# Patient Record
Sex: Male | Born: 1935 | Race: White | Hispanic: No | Marital: Married | Smoking: Never smoker
Health system: Southern US, Community
[De-identification: ages and names within clinical notes are randomized; demographics above are authoritative.]

## PROBLEM LIST (undated history)

## (undated) DIAGNOSIS — C801 Malignant (primary) neoplasm, unspecified: Secondary | ICD-10-CM

## (undated) DIAGNOSIS — Z87891 Personal history of nicotine dependence: Secondary | ICD-10-CM

## (undated) DIAGNOSIS — E7800 Pure hypercholesterolemia, unspecified: Secondary | ICD-10-CM

## (undated) DIAGNOSIS — F419 Anxiety disorder, unspecified: Secondary | ICD-10-CM

## (undated) DIAGNOSIS — F32A Depression, unspecified: Secondary | ICD-10-CM

## (undated) DIAGNOSIS — I219 Acute myocardial infarction, unspecified: Secondary | ICD-10-CM

## (undated) DIAGNOSIS — I2510 Atherosclerotic heart disease of native coronary artery without angina pectoris: Secondary | ICD-10-CM

## (undated) DIAGNOSIS — K829 Disease of gallbladder, unspecified: Secondary | ICD-10-CM

## (undated) DIAGNOSIS — I252 Old myocardial infarction: Secondary | ICD-10-CM

## (undated) DIAGNOSIS — F329 Major depressive disorder, single episode, unspecified: Secondary | ICD-10-CM

## (undated) DIAGNOSIS — M629 Disorder of muscle, unspecified: Secondary | ICD-10-CM

## (undated) DIAGNOSIS — J439 Emphysema, unspecified: Secondary | ICD-10-CM

## (undated) DIAGNOSIS — K859 Acute pancreatitis without necrosis or infection, unspecified: Secondary | ICD-10-CM

## (undated) DIAGNOSIS — M539 Dorsopathy, unspecified: Secondary | ICD-10-CM

## (undated) DIAGNOSIS — J45909 Unspecified asthma, uncomplicated: Secondary | ICD-10-CM

## (undated) DIAGNOSIS — E119 Type 2 diabetes mellitus without complications: Secondary | ICD-10-CM

## (undated) DIAGNOSIS — Z86718 Personal history of other venous thrombosis and embolism: Secondary | ICD-10-CM

## (undated) DIAGNOSIS — I499 Cardiac arrhythmia, unspecified: Secondary | ICD-10-CM

## (undated) HISTORY — DX: Acute pancreatitis without necrosis or infection, unspecified: K85.90

## (undated) HISTORY — DX: Disorder of muscle, unspecified: M62.9

## (undated) HISTORY — DX: Dorsopathy, unspecified: M53.9

## (undated) HISTORY — DX: Old myocardial infarction: I25.2

## (undated) HISTORY — DX: Malignant (primary) neoplasm, unspecified: C80.1

## (undated) HISTORY — PX: CHOLECYSTECTOMY: SHX55

## (undated) HISTORY — DX: Disease of gallbladder, unspecified: K82.9

## (undated) HISTORY — DX: Personal history of nicotine dependence: Z87.891

## (undated) HISTORY — DX: Type 2 diabetes mellitus without complications: E11.9

## (undated) HISTORY — DX: Cardiac arrhythmia, unspecified: I49.9

## (undated) HISTORY — PX: BACK SURGERY: SHX140

## (undated) HISTORY — DX: Pure hypercholesterolemia, unspecified: E78.00

## (undated) HISTORY — DX: Atherosclerotic heart disease of native coronary artery without angina pectoris: I25.10

## (undated) HISTORY — DX: Emphysema, unspecified: J43.9

## (undated) HISTORY — DX: Unspecified asthma, uncomplicated: J45.909

## (undated) HISTORY — DX: Anxiety disorder, unspecified: F41.9

## (undated) HISTORY — DX: Acute myocardial infarction, unspecified: I21.9

## (undated) HISTORY — DX: Depression, unspecified: F32.A

## (undated) HISTORY — PX: OTHER SURGICAL HISTORY: SHX169

## (undated) HISTORY — DX: Major depressive disorder, single episode, unspecified: F32.9

## (undated) HISTORY — DX: Personal history of other venous thrombosis and embolism: Z86.718

## (undated) HISTORY — PX: APPENDECTOMY: SHX54

## (undated) HISTORY — PX: KNEE SURGERY: SHX244

---

## 2004-10-25 DIAGNOSIS — I10 Essential (primary) hypertension: Secondary | ICD-10-CM | POA: Insufficient documentation

## 2007-11-27 ENCOUNTER — Ambulatory Visit: Payer: Self-pay | Admitting: Family Medicine

## 2007-12-09 ENCOUNTER — Encounter: Payer: Self-pay | Admitting: Family Medicine

## 2008-01-03 ENCOUNTER — Encounter: Payer: Self-pay | Admitting: Family Medicine

## 2008-02-05 ENCOUNTER — Ambulatory Visit: Payer: Self-pay | Admitting: Family Medicine

## 2009-11-04 ENCOUNTER — Ambulatory Visit: Payer: Self-pay | Admitting: Family Medicine

## 2010-11-10 DIAGNOSIS — E113599 Type 2 diabetes mellitus with proliferative diabetic retinopathy without macular edema, unspecified eye: Secondary | ICD-10-CM | POA: Insufficient documentation

## 2011-01-10 DIAGNOSIS — F3340 Major depressive disorder, recurrent, in remission, unspecified: Secondary | ICD-10-CM | POA: Insufficient documentation

## 2011-01-16 DIAGNOSIS — L570 Actinic keratosis: Secondary | ICD-10-CM | POA: Insufficient documentation

## 2011-01-16 DIAGNOSIS — Z85828 Personal history of other malignant neoplasm of skin: Secondary | ICD-10-CM | POA: Insufficient documentation

## 2011-03-04 DIAGNOSIS — I4891 Unspecified atrial fibrillation: Secondary | ICD-10-CM | POA: Insufficient documentation

## 2011-03-07 DIAGNOSIS — E7800 Pure hypercholesterolemia, unspecified: Secondary | ICD-10-CM | POA: Insufficient documentation

## 2011-03-07 DIAGNOSIS — I5020 Unspecified systolic (congestive) heart failure: Secondary | ICD-10-CM | POA: Insufficient documentation

## 2011-10-06 DIAGNOSIS — E11311 Type 2 diabetes mellitus with unspecified diabetic retinopathy with macular edema: Secondary | ICD-10-CM | POA: Insufficient documentation

## 2011-10-06 DIAGNOSIS — E113399 Type 2 diabetes mellitus with moderate nonproliferative diabetic retinopathy without macular edema, unspecified eye: Secondary | ICD-10-CM | POA: Insufficient documentation

## 2011-10-06 DIAGNOSIS — H40009 Preglaucoma, unspecified, unspecified eye: Secondary | ICD-10-CM | POA: Insufficient documentation

## 2011-10-11 DIAGNOSIS — L280 Lichen simplex chronicus: Secondary | ICD-10-CM | POA: Insufficient documentation

## 2011-10-16 DIAGNOSIS — M7530 Calcific tendinitis of unspecified shoulder: Secondary | ICD-10-CM | POA: Insufficient documentation

## 2011-11-22 DIAGNOSIS — Z87898 Personal history of other specified conditions: Secondary | ICD-10-CM | POA: Insufficient documentation

## 2012-02-06 ENCOUNTER — Inpatient Hospital Stay: Payer: Self-pay | Admitting: Internal Medicine

## 2012-02-06 LAB — URINALYSIS, COMPLETE
Bilirubin,UR: NEGATIVE
Glucose,UR: NEGATIVE mg/dL (ref 0–75)
Hyaline Cast: 15
Leukocyte Esterase: NEGATIVE
Nitrite: NEGATIVE
Protein: 100
RBC,UR: 5 /HPF (ref 0–5)
Specific Gravity: 1.011 (ref 1.003–1.030)
Squamous Epithelial: NONE SEEN
WBC UR: <1 /HPF (ref 0–5)

## 2012-02-06 LAB — CK-MB: CK-MB: 2.4 ng/mL (ref 0.5–3.6)

## 2012-02-06 LAB — COMPREHENSIVE METABOLIC PANEL
Anion Gap: 7 (ref 7–16)
Bilirubin,Total: 1.2 mg/dL — ABNORMAL HIGH (ref 0.2–1.0)
EGFR (African American): >60
EGFR (Non-African Amer.): 54 — ABNORMAL LOW
Potassium: 4.2 mmol/L (ref 3.5–5.1)
SGOT(AST): 29 U/L (ref 15–37)
SGPT (ALT): 32 U/L
Sodium: 133 mmol/L — ABNORMAL LOW (ref 136–145)

## 2012-02-06 LAB — CBC
MCH: 30.7 pg (ref 26.0–34.0)
MCV: 89 fL (ref 80–100)

## 2012-02-06 LAB — PROTIME-INR
INR: 1.2
Prothrombin Time: 15.5 secs — ABNORMAL HIGH (ref 11.5–14.7)

## 2012-02-06 LAB — CK TOTAL AND CKMB (NOT AT ARMC): CK, Total: 165 U/L (ref 35–232)

## 2012-02-06 LAB — TROPONIN I: Troponin-I: 0.04 ng/mL

## 2012-02-07 LAB — CBC WITH DIFFERENTIAL/PLATELET
Basophil #: 0 10*3/uL (ref 0.0–0.1)
Basophil %: 0.1 %
Eosinophil #: 0 10*3/uL (ref 0.0–0.7)
Eosinophil %: 0 %
Lymphocyte #: 0.5 10*3/uL — ABNORMAL LOW (ref 1.0–3.6)
Lymphocyte %: 7.8 %
MCH: 31 pg (ref 26.0–34.0)
MCHC: 34.8 g/dL (ref 32.0–36.0)
MCV: 89 fL (ref 80–100)
Platelet: 194 10*3/uL (ref 150–440)
RDW: 16 % — ABNORMAL HIGH (ref 11.5–14.5)
WBC: 6.5 10*3/uL (ref 3.8–10.6)

## 2012-02-07 LAB — BASIC METABOLIC PANEL
Anion Gap: 9 (ref 7–16)
BUN: 32 mg/dL — ABNORMAL HIGH (ref 7–18)
Calcium, Total: 8.5 mg/dL (ref 8.5–10.1)
Chloride: 90 mmol/L — ABNORMAL LOW (ref 98–107)
Co2: 32 mmol/L (ref 21–32)
Creatinine: 1.25 mg/dL (ref 0.60–1.30)
EGFR (African American): >60
EGFR (Non-African Amer.): 57 — ABNORMAL LOW
Glucose: 426 mg/dL — ABNORMAL HIGH (ref 65–99)
Sodium: 131 mmol/L — ABNORMAL LOW (ref 136–145)

## 2012-02-07 LAB — CK-MB: CK-MB: 4.1 ng/mL — ABNORMAL HIGH (ref 0.5–3.6)

## 2012-02-08 LAB — URINE CULTURE

## 2012-02-09 LAB — EXPECTORATED SPUTUM ASSESSMENT W GRAM STAIN, RFLX TO RESP C

## 2012-02-12 LAB — CULTURE, BLOOD (SINGLE)

## 2012-04-05 DIAGNOSIS — E039 Hypothyroidism, unspecified: Secondary | ICD-10-CM | POA: Insufficient documentation

## 2012-07-31 ENCOUNTER — Ambulatory Visit: Payer: Self-pay | Admitting: Ophthalmology

## 2012-07-31 LAB — PROTIME-INR: INR: 1.7

## 2012-08-14 ENCOUNTER — Ambulatory Visit: Payer: Self-pay | Admitting: Ophthalmology

## 2013-01-17 DIAGNOSIS — E119 Type 2 diabetes mellitus without complications: Secondary | ICD-10-CM | POA: Insufficient documentation

## 2013-01-17 DIAGNOSIS — N400 Enlarged prostate without lower urinary tract symptoms: Secondary | ICD-10-CM | POA: Insufficient documentation

## 2013-01-17 DIAGNOSIS — G4733 Obstructive sleep apnea (adult) (pediatric): Secondary | ICD-10-CM | POA: Insufficient documentation

## 2013-01-17 DIAGNOSIS — J449 Chronic obstructive pulmonary disease, unspecified: Secondary | ICD-10-CM | POA: Insufficient documentation

## 2013-01-17 DIAGNOSIS — I2510 Atherosclerotic heart disease of native coronary artery without angina pectoris: Secondary | ICD-10-CM | POA: Insufficient documentation

## 2013-08-07 IMAGING — CR DG CHEST 1V PORT
1 series · 1 of 1 positions shown · non-contrast
Comparison: none

REASON FOR EXAM: short of breath
COMMENTS:

PROCEDURE:     DXR - DXR PORTABLE CHEST SINGLE VIEW  - February 06, 2012  [DATE]
RESULT:     Right lower lobe infiltrate is noted consistent with pneumonia.

[portable]
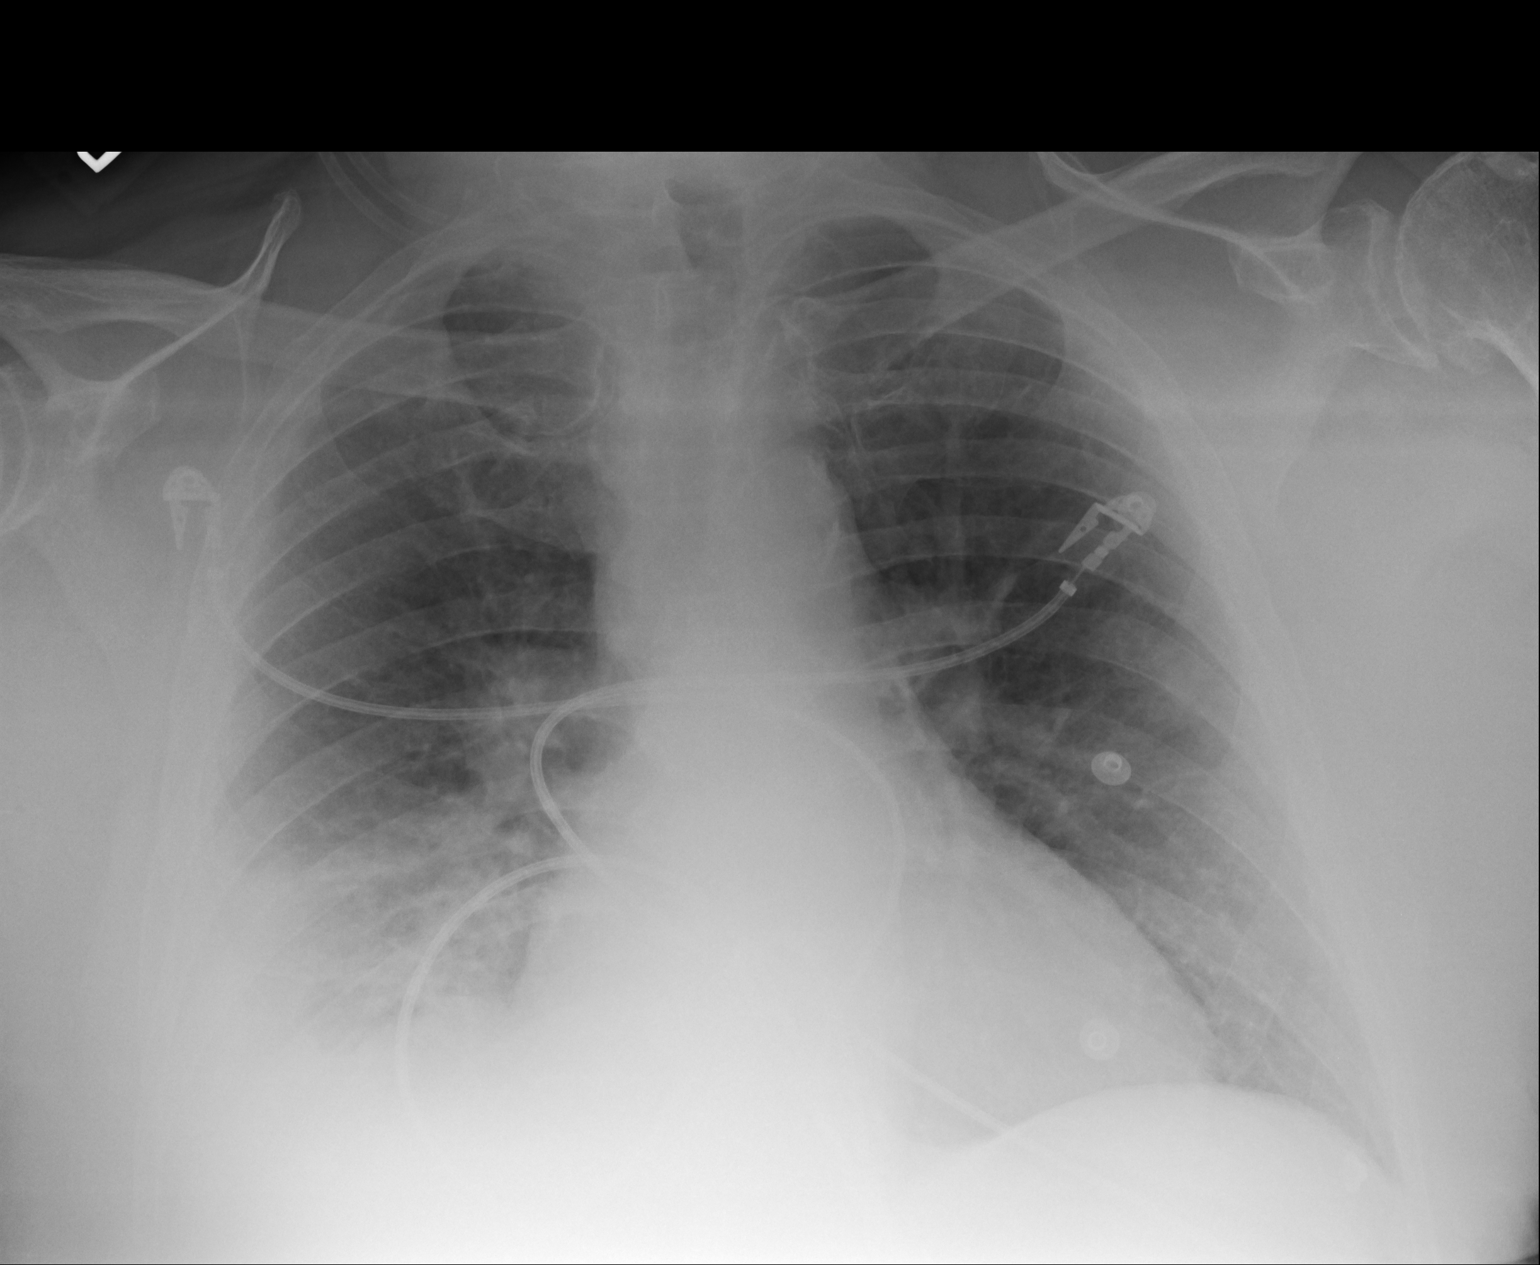

[1 of 1 positions shown; findings below may reference images not displayed]

IMPRESSION: Right lower lobe pneumonia is noted.

## 2014-09-04 DIAGNOSIS — C801 Malignant (primary) neoplasm, unspecified: Secondary | ICD-10-CM

## 2014-09-04 HISTORY — DX: Malignant (primary) neoplasm, unspecified: C80.1

## 2014-11-19 DIAGNOSIS — E1122 Type 2 diabetes mellitus with diabetic chronic kidney disease: Secondary | ICD-10-CM | POA: Insufficient documentation

## 2014-12-22 NOTE — Op Note (Signed)
PATIENT NAME:  Bryan Merritt, Bryan Merritt MR#:  [id number] DATE OF BIRTH:  September 05, 1935  DATE OF PROCEDURE:  08/14/2012  PROCEDURES PERFORMED:  1. Pars plana vitrectomy of the right eye.  2. Panretinal photocoagulation of the right eye.   PREOPERATIVE DIAGNOSES:  1. Nonclearing vitreous hemorrhage.  2. Proliferative retinopathy.   POSTOPERATIVE DIAGNOSES:  1. Nonclearing vitreous hemorrhage.  2. Proliferative retinopathy.   ESTIMATED BLOOD LOSS: Less than 1 mL.   PRIMARY SURGEON: Teresa Pelton. Arianah Torgeson, MD  ANESTHESIA: Retrobulbar block of the right eye with monitored anesthesia care.   COMPLICATIONS: None.   ESTIMATED BLOOD LOSS: Less than 1 mL.   INDICATION FOR PROCEDURE: This is a patient who presented to my office with decreased vision in the right eye. Examination revealed a vitreous hemorrhage centrally over the fovea. Several months were allowed to elapse and the patient bled multiple times. Risks, benefits, and alternatives of the above procedure were discussed and the patient wished to proceed.   DETAILS OF PROCEDURE: After informed consent was obtained, the patient was brought into the operative suite at [facility]. Patient was placed in supine position, was given a small dose of Alfenta and a retrobulbar block was performed on the right eye by the primary surgeon without any complications. Right eye was prepped and draped in sterile manner. After lid speculum was inserted, a 25-gauge trocar was placed inferotemporally through displaced conjunctiva in an oblique fashion. A 25-gauge trocar was placed inferotemporally through displaced conjunctiva in an oblique fashion 3 mm beyond the limbus. The infusion cannula was turned on and inserted through the trocar and secured in position with Steri-Strips. Two more trocars were placed in a similar fashion superotemporally and superonasally. Vitreous cutter and light pipe were introduced in the eye and a core vitrectomy was  performed. During the course of vitrectomy the patient was awake and began to move his head. Patient was reminded to remain still. The posterior vitreous face was confirmed to be elevated using suction. Peripheral vitreous was trimmed for 360 degrees without any complications. Endolaser was introduced into the eye and panretinal photocoagulation was performed. During the course of panretinal photocoagulation, approximately 90% complete, patient began to jerk his head and one of the trocars was removed. A scleral depressed exam was performed and no signs of any breaks or tears could be identified for 360 degrees and a partial air-fluid exchange was then performed and remaining trocars were removed. The posterior retina and peripheral retina, again, was noted to be undamaged due to movement. Panretinal photocoagulation was confirmed to be 90% complete. The wound sites were noted to be airtight and 5 mg of dexamethasone was given into the inferior fornix. The lid speculum was removed and the eye was cleaned. TobraDex was placed on the eye and a patch and shield were placed over the eye. Patient was taken to postanesthesia care with instructions to remain head up. We will do a staged completion of his panretinal photocoagulation in the office.   ____________________________ Teresa Pelton. Kaleel Schmieder, MD mfa:cms D: 08/14/2012 10:51:32 ET T: 08/14/2012 11:09:36 ET JOB#: [id number]  cc: Teresa Pelton. Starling Manns, MD, <Dictator>  Coralee Rud MD ELECTRONICALLY SIGNED 08/21/2012 9:36

## 2014-12-27 NOTE — Discharge Summary (Signed)
PATIENT NAME:  Bryan Merritt, Bryan Merritt MR#:  [id number] DATE OF BIRTH:  June 25, 1936  DATE OF ADMISSION:  02/06/2012 DATE OF DISCHARGE:  02/07/2012  The patient LEFT AGAINST MEDICAL ADVICE.  No Discharge Diagnosis or medication list will be dictated since patient left AGAINST MEDICAL ADVICE.   HISTORY OF PRESENT ILLNESS/HOSPITAL COURSE: Mr. Bryan Merritt is a 79 year old Caucasian gentleman with multiple medical problems who was admitted from the Emergency Room with fever, shortness of breath and SIRS due to right lower lobe pneumonia. He was started on IV fluids, continued on oxygen, IV Solu-Medrol and all his home medications. Patient and wife were upset since EMS brought patient to [facility] instead of [facility] per their request, however. They were requesting transfer to [facility] which I tried calling [facility] transfer center. There was a long wait and patient and wife were not agreeable to stay inhouse for further treatment and management. Patient relations was involved. Patient's wife discussed with patient relations. They decided to leave Bryan Merritt. Risks and complications were discussed and they seemed to voice understanding.   TIME SPENT: 40 minutes.  ____________________________ Hart Rochester Posey Pronto, MD sap:cms D: 02/07/2012 14:39:21 ET T: 02/08/2012 09:39:48 ET JOB#: [id number]  cc: Dreydon Cardenas A. Posey Pronto, MD, <Dictator>  Ilda Basset MD ELECTRONICALLY SIGNED 02/10/2012 14:13

## 2014-12-27 NOTE — H&P (Signed)
PATIENT NAME:  Bryan Merritt, Bryan Merritt MR#:  [id number] DATE OF BIRTH:  [birth year]  DATE OF ADMISSION:  02/06/2012  PRIMARY CARE PHYSICIAN: Dr. Antony Contras at [facility] in [place]: The patient is a 79 year old Caucasian male with past medical history significant for history of COPD, history of coronary artery disease status post MI in the past, status post four stents placed in his coronary arteries, history of hyperlipidemia, diabetes mellitus for the past 20 and more years who presented to the hospital with complaints of cough and not feeling well. According to the patient, he was doing well up until approximately 3 or 4 days ago when he started having cough which he actually caught from his family members. He has been coughing and producing some yellow phlegm. He also admits of having some fevers. On arrival to the Emergency Room, his fever was as high as 102.8. He feels very uncomfortable. He admits of some wheezing as well as increasing shortness of breath as well as sore throat. He states that he is still able to drink some and eat, however, today he was not able to eat or drink much because he just did not feel well. He uses oxygen at home at 4.5 liters of oxygen through nasal cannula 24/7, however, over the past few days he was using more because of this relentless cough. On arrival to the Emergency Room, he had chest x-ray done which showed right-sided pneumonia and hospitalist services were contacted for admission. The patient is also complaining of fatigue and weakness, also painful respirations especially in the right lung area. Because of this discomfort, hospitalist services were contacted for admission.   PAST MEDICAL HISTORY:  1. History of COPD, oxygen dependent at 4.5 liters of oxygen through nasal cannula 24/7.   2. History of coronary artery disease status post MI in 1997, status post four stent placement in his coronary arteries. 3. History of  hyperlipidemia. 4. Diabetes mellitus for the past 20 and more years, insulin-dependent.  5. History of atrial fibrillation. 6. Benign prostatic hypertrophy. 7. Lower extremity swelling.  8. Questionable congestive heart failure. 9. History of hypertension. 10. Lumbar disk disease, status post surgery. 11. History of bilateral shoulder problem surgery after traumatic injury.  MEDICATIONS: 1. MiraLAX 17 grams 3 times daily. 2. Metoprolol extended-release 125 mg p.o. daily.  3. Nitrostat 0.4 mg sublingually as needed.  4. NovoLog 20 units three times daily.  5. Oxycodone 15 mg every four hours. 6. Paxil 20 mg p.o. daily.  7. Xanax 0.25 mg 3 times daily.  8. DuoNebs nebulizers 4 times daily.  9. Aspirin 81 mg p.o. daily.  10. Lipitor 40 mg p.o. daily.  11. Saline nasal gel 0.65% nasal gel one spray 3 times daily.  12. Coumadin 3 mg p.o. daily.  13. Vasotec 5 mg p.o. twice daily.  14. Flomax 0.4 mg p.o. daily.  15. Lantus 40 units three times daily.   16. Lasix 120 mg p.o. daily.  17. Magnesium gluconate 250 mg p.o. twice daily.   The patient's wife denies that patient has congestive heart failure, however, he is still continued on diuretics.  PAST SURGICAL HISTORY:  1. Gallbladder surgery. 2. Skin surgery because of cancer.  3. History of right small finger amputation. 4. History of cataract surgery, two, one year ago. 5. Previously mentioned surgeries.   ALLERGIES: None.   FAMILY HISTORY: The patient's father was alcoholic. He died of coronary artery disease at the age of 10. The  patient's mother had diabetes as well as she was blind. At the age of 24 she died of unknown abdominal cancer.   SOCIAL HISTORY: The patient is married and lives with his wife. Used to work in home improvement.   REVIEW OF SYSTEMS: Positive for fatigue and weakness, weight gain of approximately 15 pounds in the past 30 days. Admits of some blurring of vision, also glaucoma and cataracts, sore throat,  cough, wheezing, dyspnea, painful respirations in the right side of the chest, also episodes of chest pain several weeks ago in the middle of his chest, intermittent arrhythmias, heart beating really fast, also intermittent constipation, intermittent incontinence, feeling that he's not emptying his bladder, also dripping and inability to start his urine stream. Also admits of some fevers and chills. Denies any significant pains or weight loss. In regards to eyes, denies any double vision. ENT: Denies any tinnitus, allergies, epistaxis, sinus pain, dentures, difficulty swallowing. RESPIRATORY: Denies any hemoptysis. CARDIOVASCULAR: Denies any orthopnea, edema, palpitations, or syncope. GASTROINTESTINAL: Denies any nausea, vomiting, diarrhea, rectal bleeding, change in bowel habits. GENITOURINARY: Denies any dysuria, hematuria, frequency. ENDOCRINOLOGY: Denies any polydipsia, nocturia, thyroid problems, heat or cold intolerance, or thirst. HEMATOLOGIC: Denies any anemia, easy bruising, bleeding, swollen glands. SKIN: Denies any acne, rashes, lesions, or change in moles. MUSCULOSKELETAL: Denies arthritis, cramps, swelling, gout. NEUROLOGIC: No numbness, epilepsy, or tremor. PSYCHIATRY: Denies anxiety, insomnia, or depression.   PHYSICAL EXAMINATION:   VITAL SIGNS: On arrival to the hospital, temperature 102.8, pulse 100, respiratory rate 24, blood pressure 96/63, saturation 91% on oxygen therapy.   GENERAL: This is a well developed, well nourished, obese Caucasian male in no significant distress laying on the stretcher. He is somewhat somnolent but intermittently is opening his eyes and converses.   HEENT: His pupils are equal and reactive to light. Extraocular movements intact. No icterus or conjunctivitis. Has difficulty hearing. No pharyngeal erythema. Mucosa is somewhat dry.   NECK: Neck did not reveal any masses, supple, nontender. Thyroid not enlarged. No adenopathy. No JVD or carotid bruits  bilaterally. Full range of motion.   LUNGS: Markedly diminished breath sounds bilaterally. Few rhonchi as well as rales were heard. Few wheezes were heard and intermittent labored inspirations especially whenever he tries to move around, especially whenever he tries to sit up. He has increased effort to breathe and he is in respiratory distress. He has mild dullness to percussion on the right side and right lower lobe but otherwise whenever he lays down he seemed to be comfortable.   CARDIOVASCULAR: S1, S2 appreciated. No murmurs, gallops, or rubs. Rhythm irregularly irregular. PMI not lateralized. Chest is nontender to palpation. Somewhat diminished pedal pulses. No lower extremity edema, calf tenderness, or cyanosis was noted.   ABDOMEN: Protuberant, soft, nontender. Bowel sounds are present. No hepatosplenomegaly or masses were noted.   RECTAL: Deferred.   MUSCULOSKELETAL: Muscle strength able to move all extremities. Muscle strength is 5 out of 5 in all extremities. No cyanosis, degenerative joint disease, or kyphosis. Gait is not tested.   EXTREMITIES/SKIN: The patient does have amputation of distal phalanx of his right small finger. He also has healed scars from his operations on both shoulders as well as numerous other scarring of his skin but no other rashes, lesions, erythema, nodularity, or induration. Skin was warm and dry to palpation.   LYMPH: No adenopathy in the cervical region.   NEUROLOGICAL: Cranial nerves grossly intact. Sensory is intact. No dysarthria or aphasia.   NEUROLOGIC: Cranial nerves grossly intact.  Sensory is intact. No dysarthria or aphasia.   PSYCHIATRIC: The patient is alert, oriented to time, person, and place, cooperative. Memory is somewhat impaired but no significant confusion, agitation, or depression noted.   LABORATORY, DIAGNOSTIC, AND RADIOLOGICAL DATA: BMP showed glucose of 301, BUN 29, sodium 133, chloride 93, CO2 33, estimated GFR for non-African  American would be 54, calcium was 8.3, total bilirubin was 1.2, otherwise liver enzymes were normal. White blood cell count was normal at 8.4, hemoglobin 11.9, platelet count 193. Urinalysis yellow clear urine, negative for glucose, bilirubin or ketones, specific gravity 1.011, pH 5.0, 2+ blood, 100 mg/dL protein, negative for nitrites or leukocyte esterase, 5 red blood cells, less than 1 white blood cell, trace bacteria, no epithelial cells, 15 hyaline casts.   Chest x-ray, portable, single view, 02/06/2012 showed right lower lobe pneumonia.   EKG showed atrial fibrillation at rate 103, rapid ventricular response with premature aberrantly conducted complexes, low voltage QRS, no acute ST-T changes noted.   ASSESSMENT AND PLAN:  1. Systemic inflammatory response reaction. Admit patient to medical floor. Get blood cultures. Sputum cultures also are going to be ordered. The patient will be started on Rocephin as well as Zithromax due to concerns of right lower lobe pneumonia cause of systemic inflammatory response reaction. Will continue oxygen as well as DuoNebs, steroids, and will add Advair.  2. COPD exacerbation. Will continue Solu-Medrol, Advair tiotropium. Will continue oxygen therapy keeping pulse oximetry at around 88 to 92%.  3. Right lower lobe pneumonia. Antibiotics as well as cultures. Adjust antibiotics according to culture results.  4. Hyponatremia. Will be holding Lasix. Will continue the patient on low rate IV fluids and follow the patient's sodium level in the morning. 5. Hypotension. As above, will continue IV fluids. Will be holding Lasix for now.  6. Atrial fibrillation, RVR. Will continue Toprol as well as Coumadin therapy. Check pro-time stat.  7. Benign prostatic hypertrophy. Will continue Flomax. Will add finasteride. The patient does have significant symptomatology such as retention and also incomplete emptying.  8. Diabetes mellitus. Will continue home medications at lower doses  and will continue sliding scale insulin.  9. Hyperlipidemia. Continue Lipitor. 10. History of constipation. Continue MiraLAX.  11. History of anxiety and depression. Continue Paxil as well as Xanax.  12. History of degenerative disk disease versus degenerative joint disease. Continue oxycodone as well as Tylenol.   TIME SPENT: 50 minutes.    ____________________________ Theodoro Grist, MD rv:drc D: 02/06/2012 21:31:03 ET T: 02/07/2012 07:10:21 ET JOB#: [id number]  cc: Theodoro Grist, MD, <Dictator> Dr. Antony Contras at [facility] in [place] SIGNED 03/03/2012 11:08

## 2015-02-04 DIAGNOSIS — K219 Gastro-esophageal reflux disease without esophagitis: Secondary | ICD-10-CM | POA: Insufficient documentation

## 2015-06-15 DIAGNOSIS — Z7409 Other reduced mobility: Secondary | ICD-10-CM | POA: Insufficient documentation

## 2015-06-21 ENCOUNTER — Ambulatory Visit: Payer: Medicare Other | Attending: Pain Medicine | Admitting: Pain Medicine

## 2015-06-21 ENCOUNTER — Encounter: Payer: Self-pay | Admitting: Pain Medicine

## 2015-06-21 VITALS — BP 124/52 | HR 59 | Temp 98.5°F | Resp 18 | Ht 68.0 in | Wt 255.0 lb

## 2015-06-21 DIAGNOSIS — M25519 Pain in unspecified shoulder: Secondary | ICD-10-CM | POA: Diagnosis present

## 2015-06-21 DIAGNOSIS — J45909 Unspecified asthma, uncomplicated: Secondary | ICD-10-CM | POA: Insufficient documentation

## 2015-06-21 DIAGNOSIS — I2510 Atherosclerotic heart disease of native coronary artery without angina pectoris: Secondary | ICD-10-CM | POA: Diagnosis not present

## 2015-06-21 DIAGNOSIS — Z9889 Other specified postprocedural states: Secondary | ICD-10-CM | POA: Insufficient documentation

## 2015-06-21 DIAGNOSIS — I252 Old myocardial infarction: Secondary | ICD-10-CM | POA: Insufficient documentation

## 2015-06-21 DIAGNOSIS — E7800 Pure hypercholesterolemia, unspecified: Secondary | ICD-10-CM | POA: Diagnosis not present

## 2015-06-21 DIAGNOSIS — F411 Generalized anxiety disorder: Secondary | ICD-10-CM | POA: Insufficient documentation

## 2015-06-21 DIAGNOSIS — G894 Chronic pain syndrome: Secondary | ICD-10-CM | POA: Insufficient documentation

## 2015-06-21 DIAGNOSIS — M25512 Pain in left shoulder: Secondary | ICD-10-CM

## 2015-06-21 DIAGNOSIS — M549 Dorsalgia, unspecified: Secondary | ICD-10-CM

## 2015-06-21 DIAGNOSIS — M25511 Pain in right shoulder: Secondary | ICD-10-CM

## 2015-06-21 DIAGNOSIS — F1120 Opioid dependence, uncomplicated: Secondary | ICD-10-CM | POA: Diagnosis not present

## 2015-06-21 DIAGNOSIS — Z9229 Personal history of other drug therapy: Secondary | ICD-10-CM | POA: Insufficient documentation

## 2015-06-21 DIAGNOSIS — E669 Obesity, unspecified: Secondary | ICD-10-CM | POA: Insufficient documentation

## 2015-06-21 DIAGNOSIS — M25561 Pain in right knee: Secondary | ICD-10-CM

## 2015-06-21 DIAGNOSIS — E1142 Type 2 diabetes mellitus with diabetic polyneuropathy: Secondary | ICD-10-CM | POA: Insufficient documentation

## 2015-06-21 DIAGNOSIS — F32A Depression, unspecified: Secondary | ICD-10-CM | POA: Insufficient documentation

## 2015-06-21 DIAGNOSIS — F418 Other specified anxiety disorders: Secondary | ICD-10-CM | POA: Diagnosis not present

## 2015-06-21 DIAGNOSIS — M545 Low back pain, unspecified: Secondary | ICD-10-CM | POA: Insufficient documentation

## 2015-06-21 DIAGNOSIS — G8929 Other chronic pain: Secondary | ICD-10-CM

## 2015-06-21 DIAGNOSIS — Z5181 Encounter for therapeutic drug level monitoring: Secondary | ICD-10-CM | POA: Insufficient documentation

## 2015-06-21 DIAGNOSIS — J439 Emphysema, unspecified: Secondary | ICD-10-CM

## 2015-06-21 DIAGNOSIS — E119 Type 2 diabetes mellitus without complications: Secondary | ICD-10-CM | POA: Diagnosis not present

## 2015-06-21 DIAGNOSIS — Z86718 Personal history of other venous thrombosis and embolism: Secondary | ICD-10-CM

## 2015-06-21 DIAGNOSIS — M25562 Pain in left knee: Secondary | ICD-10-CM

## 2015-06-21 DIAGNOSIS — F329 Major depressive disorder, single episode, unspecified: Secondary | ICD-10-CM | POA: Insufficient documentation

## 2015-06-21 DIAGNOSIS — F1190 Opioid use, unspecified, uncomplicated: Secondary | ICD-10-CM | POA: Diagnosis not present

## 2015-06-21 DIAGNOSIS — Z87891 Personal history of nicotine dependence: Secondary | ICD-10-CM

## 2015-06-21 DIAGNOSIS — Z79891 Long term (current) use of opiate analgesic: Secondary | ICD-10-CM | POA: Insufficient documentation

## 2015-06-21 DIAGNOSIS — H548 Legal blindness, as defined in USA: Secondary | ICD-10-CM | POA: Insufficient documentation

## 2015-06-21 HISTORY — DX: Personal history of nicotine dependence: Z87.891

## 2015-06-21 HISTORY — DX: Old myocardial infarction: I25.2

## 2015-06-21 HISTORY — DX: Emphysema, unspecified: J43.9

## 2015-06-21 HISTORY — DX: Personal history of other venous thrombosis and embolism: Z86.718

## 2015-06-21 MED ORDER — OXYCODONE HCL 5 MG PO TABS
5.0000 mg | ORAL_TABLET | Freq: Three times a day (TID) | ORAL | Status: DC | PRN
Start: 2015-06-21 — End: 2015-09-20

## 2015-06-21 MED ORDER — OXYCODONE HCL 5 MG PO TABS: 5.0000 mg | ORAL_TABLET | Freq: Three times a day (TID) | ORAL | Status: DC | PRN

## 2015-06-21 MED ORDER — OXYCODONE HCL 15 MG PO TABS: 15.0000 mg | ORAL_TABLET | Freq: Three times a day (TID) | ORAL | Status: DC | PRN

## 2015-06-21 NOTE — Progress Notes (Signed)
Patient's Name: Bryan Merritt MRN: [id number] DOB: 1936-07-06 DOS: 06/21/2015  Primary Reason(s) for Visit: Encounter for Medication Management. CC: Shoulder Pain   HPI:   Mr. Lienhard is a 79 y.o. year old, male patient, who returns today as an established patient. He has Encounter for therapeutic drug level monitoring; Long term current use of opiate analgesic; Uncomplicated opioid dependence ([place]); Opiate use; Bilateral shoulder pain; Chronic pain syndrome; Actinic keratoses; Atrial fibrillation ([place]); Borderline glaucoma; Benign fibroma of prostate; Arteriosclerosis of coronary artery; Calcific shoulder tendinitis; Chronic pain associated with significant psychosocial dysfunction; Type 2 diabetes mellitus with diabetic chronic kidney disease ([place]); Chronic obstructive pulmonary disease ([place]); Diabetic macular edema ([place]); Gastro-esophageal reflux disease without esophagitis; Type 2 diabetes mellitus ([place]); H/O neoplasm; Hypercholesterolemia; Benign hypertension; Adult hypothyroidism; Other reduced mobility; Lichenification and lichen simplex chronicus; LBP (low back pain); Recurrent major depression in remission ([place]); Moderate nonproliferative diabetic retinopathy ([place]); Obstructive apnea; H/O malignant neoplasm of skin; Other sexual dysfunction not due to a substance or known physiological condition; Systolic heart failure ([place]); Proliferative diabetic retinopathy ([place]); Chronic pain of both shoulders; Chronic low back pain; Chronic upper back pain; Bilateral chronic knee pain; Legally blind; History of knee surgery; Pain in joint, upper arm; Obesity, Class II, BMI 35-39.9; Diabetic peripheral neuropathy ([place]); Hx of long term use of blood thinners; Generalized anxiety disorder; Depression; History of MI (myocardial infarction); History of DVT (deep vein thrombosis); Ex-smoker; and Emphysema lung ([place]) on his problem list.. His primarily concern today is the Shoulder Pain   The patient is having some  significant bilateral shoulder pain and would like to have both shoulders injected. He refers having had the shoulders injected manual at times in the past, but none in the past year. Unfortunately, since his last injection he has been placed on Coumadin. He has a history of multiple coronary artery bypass surgeries as well as atrial fibrillation. We will need to contact his cardiologist to get clearance before he can stop them blood thinners in order to do the shoulder injections. I have had the opportunity to talk to the patient and his family about the risk of infection and hemarthrosis. They understood and accepted.  Pharmacotherapy Review: Side-effects or Adverse reactions: None reported. Effectiveness: Described as relatively effective, allowing for increase in activities of daily living (ADL). Onset of action: Within expected pharmacological parameters. Duration of action: Within normal limits for medication. Peak effect: Timing and results are as within normal expected parameters. [place] PMP: Compliant with practice rules and regulations. DST: Compliant with practice rules and regulations. Lab work: No new labs ordered by our practice. Treatment compliance: Compliant. Substance Use Disorder (SUD) Risk Level: Low Planned course of action: Continue therapy as is.  Allergies: Mr. Maret is allergic to lisinopril.  Meds: The patient has a current medication list which includes the following prescription(s): alprazolam, aspirin, atorvastatin, furosemide, insulin glargine, insulin regular, ipratropium, magnesium oxide, metoprolol succinate, nitroglycerin, omeprazole, polyethylene glycol, tamsulosin, terconazole, warfarin, warfarin, oxycodone, oxycodone, oxycodone, oxycodone, oxycodone, and oxycodone. Requested Prescriptions   Signed Prescriptions Disp Refills  . oxyCODONE (ROXICODONE) 15 MG immediate release tablet 90 tablet 0    Sig: Take 1 tablet (15 mg total) by mouth every 8 (eight) hours as  needed for pain.  Marland Kitchen oxyCODONE (OXY IR/ROXICODONE) 5 MG immediate release tablet 90 tablet 0    Sig: Take 1 tablet (5 mg total) by mouth every 8 (eight) hours as needed for severe pain.  Marland Kitchen oxyCODONE (ROXICODONE) 15 MG immediate release tablet 90 tablet  0    Sig: Take 1 tablet (15 mg total) by mouth every 8 (eight) hours as needed for pain.  Marland Kitchen oxyCODONE (OXY IR/ROXICODONE) 5 MG immediate release tablet 90 tablet 0    Sig: Take 1 tablet (5 mg total) by mouth every 8 (eight) hours as needed for severe pain.  Marland Kitchen oxyCODONE (ROXICODONE) 15 MG immediate release tablet 90 tablet 0    Sig: Take 1 tablet (15 mg total) by mouth every 8 (eight) hours as needed for pain.  Marland Kitchen oxyCODONE (OXY IR/ROXICODONE) 5 MG immediate release tablet 90 tablet 0    Sig: Take 1 tablet (5 mg total) by mouth every 8 (eight) hours as needed for severe pain.    ROS: Constitutional: Afebrile, no chills, well hydrated and well nourished Gastrointestinal: negative Musculoskeletal:negative Neurological: negative Behavioral/Psych: negative  PFSH: Medical:  Mr. Kretschmer  has a past medical history of Hypercholesteremia; CAD (coronary artery disease); MI (myocardial infarction) ([place]); Cardiac arrhythmia; Emphysema of lung ([place]); Asthma; Pancreatitis; Gallbladder disease; Diabetes mellitus without complication ([place]); Spine disorder; Muscle disorder; Depression; Anxiety; Cancer ([place]) (2016); History of DVT (deep vein thrombosis) (06/21/2015); History of MI (myocardial infarction) (06/21/2015); Ex-smoker (06/21/2015); and Emphysema lung ([place]) (06/21/2015). Family: family history includes Alcohol abuse in his father; Cancer in his mother; Diabetes in his mother; Heart disease in his father. Surgical:  has past surgical history that includes cardiac stents; Cholecystectomy; Appendectomy; Back surgery; and Knee surgery (Bilateral). Tobacco:  has no tobacco history on file. Alcohol:  has no alcohol history on file. Drug:  has no drug history on  file.  Physical Exam: Vitals:  Today's Vitals   06/21/15 1410 06/21/15 1413  BP: 124/52   Pulse: 59   Temp: 98.5 F (36.9 C)   Resp: 18   Height: 5\' 8"  (1.727 m)   Weight: 255 lb (115.667 kg)   SpO2:  99%  PainSc: 8  8   PainLoc: Shoulder   Calculated BMI: Body mass index is 38.78 kg/(m^2). General appearance: alert, cooperative, appears stated age, mild distress and morbidly obese Eyes: conjunctivae/corneas clear. PERRL, EOM's intact. Fundi benign. Lungs: No evidence respiratory distress, no audible rales or ronchi and no use of accessory muscles of respiration Neck: no adenopathy, no carotid bruit, no JVD, supple, symmetrical, trachea midline and thyroid not enlarged, symmetric, no tenderness/mass/nodules Back: symmetric, no curvature. ROM normal. No CVA tenderness. Extremities: extremities normal, atraumatic, no cyanosis or edema Pulses: 2+ and symmetric Skin: Skin color, texture, turgor normal. No rashes or lesions Neurologic: Gait: The patient ambulates in a wheelchair.    Assessment: Encounter Diagnosis:  Primary Diagnosis: Chronic pain syndrome [G89.4]  Plan: [place] was seen today for shoulder pain.  Diagnoses and all orders for this visit:  Chronic pain syndrome  Bilateral shoulder pain Comments: The family the patient presents to the pain clinic on 06/21/2015 with a flareup of his bilateral shoulder pain. (Acute on chronic pain) Orders: -     SHOULDER INJECTION; Future  Opiate use  Uncomplicated opioid dependence ([place])  Long term current use of opiate analgesic -     Drugs of abuse screen w/o alc, rtn urine-sln; Future  Encounter for therapeutic drug level monitoring  Chronic pain of both shoulders  Other orders -     oxyCODONE (ROXICODONE) 15 MG immediate release tablet; Take 1 tablet (15 mg total) by mouth every 8 (eight) hours as needed for pain. -     oxyCODONE (OXY IR/ROXICODONE) 5 MG immediate release tablet; Take 1 tablet (5 mg  total) by mouth  every 8 (eight) hours as needed for severe pain. -     oxyCODONE (ROXICODONE) 15 MG immediate release tablet; Take 1 tablet (15 mg total) by mouth every 8 (eight) hours as needed for pain. -     oxyCODONE (OXY IR/ROXICODONE) 5 MG immediate release tablet; Take 1 tablet (5 mg total) by mouth every 8 (eight) hours as needed for severe pain. -     oxyCODONE (ROXICODONE) 15 MG immediate release tablet; Take 1 tablet (15 mg total) by mouth every 8 (eight) hours as needed for pain. -     oxyCODONE (OXY IR/ROXICODONE) 5 MG immediate release tablet; Take 1 tablet (5 mg total) by mouth every 8 (eight) hours as needed for severe pain.     There are no Patient Instructions on file for this visit. Medications discontinued today:  Medications Discontinued During This Encounter  Medication Reason  . oxycodone (OXY-IR) 5 MG capsule Duplicate  . clopidogrel (PLAVIX) 75 MG tablet Error   Medications administered today:  Mr. Walgren had no medications administered during this visit.  Primary Care Physician: Shella Spearing, MD Location: [facility] Note by: Kathlen Brunswick. Dossie Arbour, M.D, DABA, DABAPM, DABPM, DABIPP, FIPP

## 2015-06-21 NOTE — Progress Notes (Signed)
Oxycodone 15 mg Pill count #16/90 Oxycodone 5 mg pill count # 16

## 2015-06-21 NOTE — Progress Notes (Signed)
Safety precautions to be maintained throughout the outpatient stay will include: orient to surroundings, keep bed in low position, maintain call bell within reach at all times, provide assistance with transfer out of bed and ambulation.  

## 2015-06-21 NOTE — Progress Notes (Signed)
Dr Dossie Arbour sent communication to  Bryan Mann, NP for permission to stop Coumadin.

## 2015-07-13 ENCOUNTER — Telehealth: Payer: Self-pay | Admitting: Pain Medicine

## 2015-07-13 NOTE — Telephone Encounter (Signed)
Has Bryan Merritt been cleared by his heart phys to have a procedure

## 2015-07-19 NOTE — Telephone Encounter (Signed)
Go ahead and have the patient is scheduled for a bilateral intra-articular shoulder injection under fluoroscopic guidance. Asked the patient if he would like to have it done with or without sedation. Make sure that the patient stops today's Coumadin for 5 days prior to the procedure. If you have any questions with regards to the paralysis on how to do this, please ask Kory.

## 2015-08-02 ENCOUNTER — Telehealth: Payer: Self-pay | Admitting: Pain Medicine

## 2015-08-02 NOTE — Telephone Encounter (Signed)
Kori have you sent note to Dr. Priscella Mann, cardiologist to see if mr Gladwin can stop warfarin so he can have procedure

## 2015-08-04 NOTE — Telephone Encounter (Signed)
Patient's wife notified that a note was sent to Dr. Bobby Rumpf, but we have not heard anything from them.

## 2015-09-01 DIAGNOSIS — R0681 Apnea, not elsewhere classified: Secondary | ICD-10-CM | POA: Insufficient documentation

## 2015-09-20 ENCOUNTER — Other Ambulatory Visit
Admission: RE | Admit: 2015-09-20 | Discharge: 2015-09-20 | Disposition: A | Discharge status: Home / Self Care | Payer: Medicare Other | Source: Ambulatory Visit | Attending: Pain Medicine | Admitting: Pain Medicine

## 2015-09-20 ENCOUNTER — Ambulatory Visit: Payer: Medicare Other | Attending: Pain Medicine | Admitting: Pain Medicine

## 2015-09-20 ENCOUNTER — Encounter: Payer: Self-pay | Admitting: Pain Medicine

## 2015-09-20 VITALS — BP 115/58 | HR 100 | Temp 97.9°F | Resp 16 | Ht 68.5 in | Wt 252.0 lb

## 2015-09-20 DIAGNOSIS — E1142 Type 2 diabetes mellitus with diabetic polyneuropathy: Secondary | ICD-10-CM | POA: Insufficient documentation

## 2015-09-20 DIAGNOSIS — I4891 Unspecified atrial fibrillation: Secondary | ICD-10-CM | POA: Diagnosis not present

## 2015-09-20 DIAGNOSIS — Z5181 Encounter for therapeutic drug level monitoring: Secondary | ICD-10-CM | POA: Diagnosis not present

## 2015-09-20 DIAGNOSIS — J449 Chronic obstructive pulmonary disease, unspecified: Secondary | ICD-10-CM | POA: Diagnosis not present

## 2015-09-20 DIAGNOSIS — Z86718 Personal history of other venous thrombosis and embolism: Secondary | ICD-10-CM | POA: Insufficient documentation

## 2015-09-20 DIAGNOSIS — N400 Enlarged prostate without lower urinary tract symptoms: Secondary | ICD-10-CM | POA: Diagnosis not present

## 2015-09-20 DIAGNOSIS — M25512 Pain in left shoulder: Secondary | ICD-10-CM | POA: Insufficient documentation

## 2015-09-20 DIAGNOSIS — I252 Old myocardial infarction: Secondary | ICD-10-CM | POA: Insufficient documentation

## 2015-09-20 DIAGNOSIS — H409 Unspecified glaucoma: Secondary | ICD-10-CM | POA: Insufficient documentation

## 2015-09-20 DIAGNOSIS — M810 Age-related osteoporosis without current pathological fracture: Secondary | ICD-10-CM | POA: Diagnosis not present

## 2015-09-20 DIAGNOSIS — M25511 Pain in right shoulder: Secondary | ICD-10-CM | POA: Insufficient documentation

## 2015-09-20 DIAGNOSIS — E669 Obesity, unspecified: Secondary | ICD-10-CM | POA: Diagnosis not present

## 2015-09-20 DIAGNOSIS — K219 Gastro-esophageal reflux disease without esophagitis: Secondary | ICD-10-CM | POA: Diagnosis not present

## 2015-09-20 DIAGNOSIS — Z9049 Acquired absence of other specified parts of digestive tract: Secondary | ICD-10-CM | POA: Insufficient documentation

## 2015-09-20 DIAGNOSIS — M792 Neuralgia and neuritis, unspecified: Secondary | ICD-10-CM | POA: Insufficient documentation

## 2015-09-20 DIAGNOSIS — I2510 Atherosclerotic heart disease of native coronary artery without angina pectoris: Secondary | ICD-10-CM | POA: Diagnosis not present

## 2015-09-20 DIAGNOSIS — F1190 Opioid use, unspecified, uncomplicated: Secondary | ICD-10-CM | POA: Insufficient documentation

## 2015-09-20 DIAGNOSIS — Z9181 History of falling: Secondary | ICD-10-CM | POA: Insufficient documentation

## 2015-09-20 DIAGNOSIS — Z79899 Other long term (current) drug therapy: Secondary | ICD-10-CM

## 2015-09-20 DIAGNOSIS — Z79891 Long term (current) use of opiate analgesic: Secondary | ICD-10-CM | POA: Diagnosis not present

## 2015-09-20 DIAGNOSIS — E1122 Type 2 diabetes mellitus with diabetic chronic kidney disease: Secondary | ICD-10-CM | POA: Insufficient documentation

## 2015-09-20 DIAGNOSIS — E7800 Pure hypercholesterolemia, unspecified: Secondary | ICD-10-CM | POA: Diagnosis not present

## 2015-09-20 DIAGNOSIS — G8929 Other chronic pain: Secondary | ICD-10-CM | POA: Insufficient documentation

## 2015-09-20 DIAGNOSIS — Z7189 Other specified counseling: Secondary | ICD-10-CM

## 2015-09-20 DIAGNOSIS — M545 Low back pain, unspecified: Secondary | ICD-10-CM

## 2015-09-20 DIAGNOSIS — R296 Repeated falls: Secondary | ICD-10-CM

## 2015-09-20 DIAGNOSIS — Q649 Congenital malformation of urinary system, unspecified: Secondary | ICD-10-CM

## 2015-09-20 LAB — COMPREHENSIVE METABOLIC PANEL
ALBUMIN: 3.9 g/dL (ref 3.5–5.0)
ALK PHOS: 52 U/L (ref 38–126)
ALT: 17 U/L (ref 17–63)
AST: 16 U/L (ref 15–41)
Anion gap: 8 (ref 5–15)
BILIRUBIN TOTAL: 1 mg/dL (ref 0.3–1.2)
BUN: 32 mg/dL — AB (ref 6–20)
CALCIUM: 8.6 mg/dL — AB (ref 8.9–10.3)
CO2: 33 mmol/L — ABNORMAL HIGH (ref 22–32)
CREATININE: 1.48 mg/dL — AB (ref 0.61–1.24)
Chloride: 95 mmol/L — ABNORMAL LOW (ref 101–111)
GFR calc Af Amer: 51 mL/min — ABNORMAL LOW (ref >60)
GFR calc non Af Amer: 44 mL/min — ABNORMAL LOW (ref >60)
GLUCOSE: 203 mg/dL — AB (ref 65–99)
Potassium: 3.8 mmol/L (ref 3.5–5.1)
Sodium: 136 mmol/L (ref 135–145)
TOTAL PROTEIN: 7.1 g/dL (ref 6.5–8.1)

## 2015-09-20 LAB — MAGNESIUM: Magnesium: 2.1 mg/dL (ref 1.7–2.4)

## 2015-09-20 LAB — SEDIMENTATION RATE: Sed Rate: 37 mm/hr — ABNORMAL HIGH (ref 0–20)

## 2015-09-20 MED ORDER — OXYCODONE HCL 5 MG PO TABS: 5.0000 mg | ORAL_TABLET | Freq: Three times a day (TID) | ORAL | Status: DC | PRN

## 2015-09-20 MED ORDER — OXYCODONE HCL 15 MG PO TABS: 15.0000 mg | ORAL_TABLET | Freq: Three times a day (TID) | ORAL | Status: DC | PRN

## 2015-09-20 NOTE — Assessment & Plan Note (Signed)
His feet are numb to the point were he is having problems with proprioception and balance. He continues to fall and he requires a walker at home or a wheelchair.

## 2015-09-20 NOTE — Progress Notes (Signed)
Patient's Name: Bryan Merritt MRN: [id number] DOB: Dec 03, 1935 DOS: 09/20/2015  Primary Reason(s) for Visit: Encounter for Medication Management CC: Other   HPI:  Bryan Merritt is a 80 y.o. year old, male patient, who returns today as an established patient. He has Encounter for therapeutic drug level monitoring; Long term current use of opiate analgesic; Uncomplicated opioid dependence ([place]); Opiate use; Bilateral shoulder pain (Location of Primary Source of Pain) (Bilateral) (R>L); Chronic pain syndrome; Actinic keratoses; Atrial fibrillation ([place]); Borderline glaucoma; Benign fibroma of prostate; Arteriosclerosis of coronary artery; Calcific shoulder tendinitis; Chronic pain associated with significant psychosocial dysfunction; Type 2 diabetes mellitus with diabetic chronic kidney disease ([place]); Chronic obstructive pulmonary disease ([place]); Diabetic macular edema ([place]); Gastro-esophageal reflux disease without esophagitis; Type 2 diabetes mellitus ([place]); H/O neoplasm; Hypercholesterolemia; Benign hypertension; Adult hypothyroidism; Other reduced mobility; Lichenification and lichen simplex chronicus; LBP (low back pain); Recurrent major depression in remission ([place]); Moderate nonproliferative diabetic retinopathy ([place]); Obstructive apnea; H/O malignant neoplasm of skin; Other sexual dysfunction not due to a substance or known physiological condition; Systolic heart failure ([place]); Proliferative diabetic retinopathy ([place]); Chronic pain of both shoulders; Chronic low back pain; Chronic upper back pain; Bilateral chronic knee pain; Legally blind; History of knee surgery; Pain in joint, upper arm; Obesity, Class II, BMI 35-39.9; Diabetic peripheral neuropathy ([place]); Hx of long term use of blood thinners; Generalized anxiety disorder; Depression; History of MI (myocardial infarction); History of DVT (deep vein thrombosis); Ex-smoker; Emphysema lung ([place]); Chronic pain; Long term prescription opiate use; Encounter for  chronic pain management; Breathlessness on exertion; Neurogenic pain; Neuropathic pain; Senile osteoporosis; Prostatic hypertrophy; Urinary anomaly; and At high risk for falls on his problem list.. His primarily concern today is the Other   The patient comes in today clinics today for pharmacological management of his chronic pain. Unfortunately, this is a second time that he says that he cannot provide [place] with a urine sample. The patient indicates that he is having a lot of problems voiding. In the middle of the night he wakes up with terrible pain and when he tries to avoid almost nothing comes out. He indicates that his stream is very weak and it feels as if there was an occlusion. He is also having problems with his kidneys. Therefore, today we will go ahead and get a blood sample since he indicates that he took his pain medicine before coming in. According to the pill Patient still has 20 of the 90 oxycodone 15 filled on 08/21/2015. In addition he also has 22 of the night 80 oxycodone 5 mg pills filled on 07/21/2016. When the patient was confronted about this, they indicated that they took the pills from bed 08/21/2015 refill and they put it on day 07/21/2016 bottle. They were informed that they should never do this and that they should always keep then was bottle.   Reported Pain Score: 2  (last pain medicine @ 1400) Reported level is inconsistent with clinical obrservations. Pain Type: Chronic pain Pain Location: Shoulder Pain Orientation: Left (bilateral, left is worse) Pain Descriptors / Indicators: Constant, Sharp (hurts like hell) Pain Frequency: Constant  Date of Last Visit: 06/20/16 Service Provided on Last Visit: Med Refill  Pharmacotherapy  Review:   Onset of action: Within expected pharmacological parameters Time to Peak effect: Timing and results are as within normal expected parameters Effectiveness: Described as relatively effective, allowing for increase in activities of  daily living (ADL) % Relief: More than 50% Side-effects or Adverse reactions: None reported Duration  of action: Within normal limits for medication [place] PMP: Compliant with practice rules and regulations UDS Results: No UDS available, at this time UDS Interpretation: No UDS available, at this time Medication Assessment Form: Reviewed. Patient indicates being compliant with therapy Treatment compliance: Compliant Substance Use Disorder (SUD) Risk Level: Low Pharmacologic Plan: Continue therapy as is  Lab Work: Illicit Drugs No results found for: THCU, COCAINSCRNUR, PCPSCRNUR, MDMA, AMPHETMU, METHADONE, ETOH  Inflammation Markers No results found for: ESRSEDRATE, CRP  Renal Function Lab Results  Component Value Date   BUN 32* 02/07/2012   CREATININE 1.25 02/07/2012   GFRAA >60 02/07/2012   GFRNONAA 57* 02/07/2012    Hepatic Function Lab Results  Component Value Date   AST 29 02/06/2012   ALT 32 02/06/2012   ALBUMIN 3.4 02/06/2012    Electrolytes Lab Results  Component Value Date   NA 131* 02/07/2012   K 4.2 07/31/2012   CL 90* 02/07/2012   CALCIUM 8.5 02/07/2012    Allergies:  Bryan Merritt is allergic to lisinopril.  Meds:  The patient has a current medication list which includes the following prescription(s): albuterol, alprazolam, aspirin, atorvastatin, furosemide, insulin glargine, insulin regular, ipratropium, magnesium oxide, metoprolol succinate, nitroglycerin, omeprazole, oxycodone, oxycodone, polyethylene glycol, tamsulosin, terconazole, warfarin, warfarin, oxycodone, oxycodone, oxycodone, and oxycodone.  ROS:  Constitutional: Afebrile, no chills, well hydrated and well nourished Gastrointestinal: negative Musculoskeletal:negative Neurological: negative Behavioral/Psych: negative  PFSH:  Medical:  Bryan Merritt  has a past medical history of Hypercholesteremia; CAD (coronary artery disease); MI (myocardial infarction) ([place]); Cardiac arrhythmia; Emphysema of lung  ([place]); Asthma; Pancreatitis; Gallbladder disease; Diabetes mellitus without complication ([place]); Spine disorder; Muscle disorder; Depression; Anxiety; Cancer ([place]) (2016); History of DVT (deep vein thrombosis) (06/21/2015); History of MI (myocardial infarction) (06/21/2015); Ex-smoker (06/21/2015); and Emphysema lung ([place]) (06/21/2015). Family: family history includes Alcohol abuse in his father; Cancer in his mother; Diabetes in his mother; Heart disease in his father. Surgical:  has past surgical history that includes cardiac stents; Cholecystectomy; Appendectomy; Back surgery; and Knee surgery (Bilateral). Tobacco:  reports that he has never smoked. He does not have any smokeless tobacco history on file. Alcohol:  has no alcohol history on file. Drug:  has no drug history on file.  Physical Exam:  Vitals:  Today's Vitals   09/20/15 1403  BP: 115/58  Pulse: 100  Temp: 97.9 F (36.6 C)  TempSrc: Oral  Resp: 16  Height: 5' 8.5" (1.74 m)  Weight: 252 lb (114.306 kg)  SpO2: 100%  PainSc: 2     Calculated BMI: Body mass index is 37.75 kg/(m^2).  General appearance: alert, cooperative, appears stated age and no distress Eyes: PERLA Respiratory: No evidence respiratory distress, no audible rales or ronchi and no use of accessory muscles of respiration  Cervical Spine Inspection: Normal anatomy Alignment: Symetrical Palpation: WNL ROM: Adequate  Upper Extremities Inspection: No gross anomalies detected ROM: Decreased on both shoulders. Sensory: Normal Motor: 5/5 Pulses: Palpable  Thoracic Spine Inspection: No gross anomalies detected Alignment: Symetrical Palpation: WNL ROM: Adequate  Lumbar Spine Inspection: No gross anomalies detected Alignment: Symetrical Palpation: WNL ROM: Decreased Gait: The patient comes into the clinic today on a wheelchair, primarily because he indicates that he cannot feel his feet and therefore he has problems with balance. He continues to  fall all of the time.  Lower Extremities Inspection: No gross anomalies detected ROM: Adequate Sensory: Normal Motor: Weakness, primarily secondary to deconditioning.  Assessment & Plan:  Primary Diagnosis & Pertinent Problem List: The  primary encounter diagnosis was Chronic pain. Diagnoses of Long term prescription opiate use, Encounter for therapeutic drug level monitoring, Long term current use of opiate analgesic, Encounter for chronic pain management, Chronic low back pain, Diabetic peripheral neuropathy ([place]), Neurogenic pain, Neuropathic pain, Senile osteoporosis, Prostatic hypertrophy, Urinary anomaly, and At high risk for falls were also pertinent to this visit.  Assessment: Diabetic peripheral neuropathy (HCC) His feet are numb to the point were he is having problems with proprioception and balance. He continues to fall and he requires a walker at home or a wheelchair.    Pharmacotherapy Orders: Meds ordered this encounter  Medications  . oxyCODONE (OXY IR/ROXICODONE) 5 MG immediate release tablet    Sig: Take 1 tablet (5 mg total) by mouth every 8 (eight) hours as needed for moderate pain or severe pain.    Dispense:  90 tablet    Refill:  0    Do not place this medication, or any other prescription from our practice, on "Automatic Refill". Patient may have prescription filled one day early if pharmacy is closed on scheduled refill date. Do not fill until: 09/20/15 To last until: 10/20/15  . oxyCODONE (OXY IR/ROXICODONE) 5 MG immediate release tablet    Sig: Take 1 tablet (5 mg total) by mouth every 8 (eight) hours as needed for moderate pain or severe pain.    Dispense:  90 tablet    Refill:  0    Do not place this medication, or any other prescription from our practice, on "Automatic Refill". Patient may have prescription filled one day early if pharmacy is closed on scheduled refill date. Do not fill until: 10/20/15 To last until: 11/16/15  . oxyCODONE (OXY  IR/ROXICODONE) 5 MG immediate release tablet    Sig: Take 1 tablet (5 mg total) by mouth every 8 (eight) hours as needed for moderate pain or severe pain.    Dispense:  90 tablet    Refill:  0    Do not place this medication, or any other prescription from our practice, on "Automatic Refill". Patient may have prescription filled one day early if pharmacy is closed on scheduled refill date. Do not fill until: 11/16/15 To last until: 12/16/15  . oxyCODONE (ROXICODONE) 15 MG immediate release tablet    Sig: Take 1 tablet (15 mg total) by mouth every 8 (eight) hours as needed for pain.    Dispense:  90 tablet    Refill:  0    Do not place this medication, or any other prescription from our practice, on "Automatic Refill". Patient may have prescription filled one day early if pharmacy is closed on scheduled refill date. Do not fill until: 10/20/15 To last until: 11/16/15  . oxyCODONE (ROXICODONE) 15 MG immediate release tablet    Sig: Take 1 tablet (15 mg total) by mouth every 8 (eight) hours as needed for pain.    Dispense:  90 tablet    Refill:  0    Do not place this medication, or any other prescription from our practice, on "Automatic Refill". Patient may have prescription filled one day early if pharmacy is closed on scheduled refill date. Do not fill until: 09/20/15 To last until: 10/20/15  . oxyCODONE (ROXICODONE) 15 MG immediate release tablet    Sig: Take 1 tablet (15 mg total) by mouth every 8 (eight) hours as needed for pain.    Dispense:  90 tablet    Refill:  0    Do not place this medication, or  any other prescription from our practice, on "Automatic Refill". Patient may have prescription filled one day early if pharmacy is closed on scheduled refill date. Do not fill until: 11/16/15 To last until: 12/16/15    [facility] & Procedure Orders: Orders Placed This Encounter  Procedures  . Comprehensive metabolic panel  . C-reactive protein  . Magnesium  . Sedimentation rate  .  Vitamin B12  . Drug Screen 10 W/Conf, Serum  . Vitamin D pnl(25-hydrxy+1,25-dihy)-bld  . PSA  . Ambulatory referral to Urology    Radiology Orders: AMB REFERRAL TO UROLOGY  Interventional Therapies: None at this point.    Administered Medications: Mr. Bohner had no medications administered during this visit.  Primary Care Physician: Shella Spearing, MD Location: [facility] Facility Note by: Kathlen Brunswick. Dossie Arbour, M.D, DABA, DABAPM, DABPM, DABIPP, FIPP

## 2015-09-20 NOTE — Progress Notes (Signed)
Patient here for medication refill.   Hydrocodone 15 mg Qty 20/90 last fill 08/21/15  Hydrocodone 5 mg qty 22/90 last fill 07/21/16  Patient states that he is unable to void in order to obtain UDS.

## 2015-09-21 LAB — VITAMIN B12: VITAMIN B 12: 368 pg/mL (ref 180–914)

## 2015-09-21 LAB — PSA: PSA: 1.52 ng/mL (ref 0.00–4.00)

## 2015-09-21 LAB — C-REACTIVE PROTEIN: CRP: 0.6 mg/dL (ref <1.0)

## 2015-09-29 LAB — DRUG SCREEN 10 W/CONF, SERUM
Amphetamines, IA: NEGATIVE ng/mL
Barbiturates, IA: NEGATIVE ug/mL
Benzodiazepines, IA: NEGATIVE ng/mL
Cocaine & Metabolite, IA: NEGATIVE ng/mL
METHADONE, IA: NEGATIVE ng/mL
OPIATES, IA: NEGATIVE ng/mL
OXYCODONES, IA: POSITIVE ng/mL
PHENCYCLIDINE, IA: NEGATIVE ng/mL
Propoxyphene, IA: NEGATIVE ng/mL
THC(MARIJUANA) METABOLITE, IA: NEGATIVE ng/mL

## 2015-09-29 LAB — OXYCODONES,MS,WB/SP RFX
Oxycocone: 77.9 ng/mL
Oxycodones Confirmation: POSITIVE
Oxymorphone: NEGATIVE ng/mL

## 2015-09-29 LAB — CALCITRIOL (1,25 DI-OH VIT D): Vit D, 1,25-Dihydroxy: 31.2 pg/mL (ref 19.9–79.3)

## 2015-10-14 NOTE — Progress Notes (Signed)
Quick Note:   A normal sedimentation rate should be below 30 mm/hr. The sed rate is an acute phase reactant that indirectly measures the degree of inflammation present in the body. It can be acute, developing rapidly after trauma, injury or infection, for example, or can occur over an extended time (chronic) with conditions such as autoimmune diseases or cancer. The ESR is not diagnostic; it is a non-specific, screening test that may be elevated in a number of these different conditions. It provides general information about the presence or absence of an inflammatory condition.  ______ 

## 2015-10-14 NOTE — Progress Notes (Signed)
Quick Note:  Normal chloride levels are between 95 and 107 mEq/L. Low levels may be due to: Addison disease; Bartter syndrome; burns; congestive heart failure; dehydration; excessive sweating; hyperaldosteronism; metabolic alkalosis; respiratory acidosis (compensated); Syndrome of inappropriate diuretic hormone secretion (SIADH); or vomiting. Most of the CO2 in the body is in the form of bicarbonate (HCO3-). Therefore, the CO2 blood test is really a measure of bicarbonate levels. kidneys help maintain the normal bicarbonate levels. The normal range is between 22 and 28 mEq/L, for our Lab. Higher levels may suggest alkalosis; renal tubular acidosis; breathing disorders; cushing syndrome; and hyperaldosteronism among others. Normal fasting (NPO x 8 hours) glucose levels are between 65-99 mg/dl, with 2 hour fasting, levels are usually less than 140 mg/dl. Any random blood glucose level greater than 200 mg/dl is considered to be Diabetes. BUN levels between 7 to 20 mg/dL (2.5 to 7.1 mmol/L) are considered normal. Elevated blood urea nitrogen can also be due to: urinary tract obstruction; congestive heart failure or recent heart attack; gastrointestinal bleeding; dehydration; shock; severe burns; certain medications, such as corticosteroids and some antibiotics; and/or a high protein diet. Normal Creatinine levels are between 0.5 and 0.9 mg/dl for our lab. Any condition that impairs the function of the kidneys is likely to raise the creatinine level in the blood. The most common causes of longstanding (chronic) kidney disease in adults are high blood pressure and diabetes. Other causes of elevated blood creatinine levels include drugs, ingestion of a large amount of dietary meat, kidney infections, rhabdomyolysis (abnormal muscle breakdown), and urinary tract obstruction. BUN-to-creatinine ratio >20:1 (BUN dispropertionally higher than the creatinine levels) suggests prerenal azotemia (dehydration or renal  hypoperfusion), while <10:1 levels suggest renal damage.Normal calcium values range from 9.0 to 10.5 mg/dL. Higher than normal levels may be due to: prolonged bed rest; execive calcium or vitamin D intake; HIV/AIDS; hyperparathyroidism; granuloma producing infections(i.e.: tuberculosis, fungal or mycobacterial infections); metastatic bone tumors; multiple myeloma; osteomalacia; hyperthyroidism; Paget's disease; sarcoidosis; tumors producing a parathyroid hormone-like substance; and use of certain medications such as lithium, tamoxifen, and thiazides. Low GFR (glomerular filtration rate) would suggest decreased renal function possibly secondary to chronic kidney disease. ______

## 2015-10-14 NOTE — Progress Notes (Signed)
Quick Note:  Lab results reviewed and found to be within normal limits. ______ 

## 2015-11-09 DIAGNOSIS — Z9181 History of falling: Secondary | ICD-10-CM | POA: Insufficient documentation

## 2015-12-12 DIAGNOSIS — S91309A Unspecified open wound, unspecified foot, initial encounter: Secondary | ICD-10-CM | POA: Insufficient documentation

## 2015-12-16 ENCOUNTER — Ambulatory Visit: Payer: Medicare Other | Attending: Pain Medicine | Admitting: Pain Medicine

## 2015-12-16 ENCOUNTER — Encounter: Payer: Self-pay | Admitting: Pain Medicine

## 2015-12-16 VITALS — BP 127/51 | HR 69 | Temp 98.5°F | Resp 18 | Ht 69.0 in | Wt 249.0 lb

## 2015-12-16 DIAGNOSIS — E7800 Pure hypercholesterolemia, unspecified: Secondary | ICD-10-CM | POA: Diagnosis not present

## 2015-12-16 DIAGNOSIS — L570 Actinic keratosis: Secondary | ICD-10-CM | POA: Insufficient documentation

## 2015-12-16 DIAGNOSIS — M25519 Pain in unspecified shoulder: Secondary | ICD-10-CM | POA: Diagnosis present

## 2015-12-16 DIAGNOSIS — M549 Dorsalgia, unspecified: Secondary | ICD-10-CM | POA: Diagnosis present

## 2015-12-16 DIAGNOSIS — I4891 Unspecified atrial fibrillation: Secondary | ICD-10-CM | POA: Insufficient documentation

## 2015-12-16 DIAGNOSIS — Z5181 Encounter for therapeutic drug level monitoring: Secondary | ICD-10-CM

## 2015-12-16 DIAGNOSIS — E11311 Type 2 diabetes mellitus with unspecified diabetic retinopathy with macular edema: Secondary | ICD-10-CM | POA: Insufficient documentation

## 2015-12-16 DIAGNOSIS — M25561 Pain in right knee: Secondary | ICD-10-CM | POA: Diagnosis not present

## 2015-12-16 DIAGNOSIS — I2510 Atherosclerotic heart disease of native coronary artery without angina pectoris: Secondary | ICD-10-CM | POA: Diagnosis not present

## 2015-12-16 DIAGNOSIS — D291 Benign neoplasm of prostate: Secondary | ICD-10-CM | POA: Diagnosis not present

## 2015-12-16 DIAGNOSIS — H548 Legal blindness, as defined in USA: Secondary | ICD-10-CM | POA: Insufficient documentation

## 2015-12-16 DIAGNOSIS — E1140 Type 2 diabetes mellitus with diabetic neuropathy, unspecified: Secondary | ICD-10-CM | POA: Insufficient documentation

## 2015-12-16 DIAGNOSIS — F1190 Opioid use, unspecified, uncomplicated: Secondary | ICD-10-CM

## 2015-12-16 DIAGNOSIS — J439 Emphysema, unspecified: Secondary | ICD-10-CM | POA: Diagnosis not present

## 2015-12-16 DIAGNOSIS — Z79891 Long term (current) use of opiate analgesic: Secondary | ICD-10-CM | POA: Diagnosis not present

## 2015-12-16 DIAGNOSIS — Z6839 Body mass index (BMI) 39.0-39.9, adult: Secondary | ICD-10-CM | POA: Diagnosis not present

## 2015-12-16 DIAGNOSIS — J449 Chronic obstructive pulmonary disease, unspecified: Secondary | ICD-10-CM | POA: Diagnosis not present

## 2015-12-16 DIAGNOSIS — H409 Unspecified glaucoma: Secondary | ICD-10-CM | POA: Diagnosis not present

## 2015-12-16 DIAGNOSIS — N400 Enlarged prostate without lower urinary tract symptoms: Secondary | ICD-10-CM | POA: Diagnosis not present

## 2015-12-16 DIAGNOSIS — M545 Low back pain: Secondary | ICD-10-CM | POA: Insufficient documentation

## 2015-12-16 DIAGNOSIS — M7530 Calcific tendinitis of unspecified shoulder: Secondary | ICD-10-CM | POA: Insufficient documentation

## 2015-12-16 DIAGNOSIS — Z86718 Personal history of other venous thrombosis and embolism: Secondary | ICD-10-CM | POA: Insufficient documentation

## 2015-12-16 DIAGNOSIS — G8929 Other chronic pain: Secondary | ICD-10-CM

## 2015-12-16 DIAGNOSIS — M25511 Pain in right shoulder: Secondary | ICD-10-CM | POA: Insufficient documentation

## 2015-12-16 DIAGNOSIS — Z955 Presence of coronary angioplasty implant and graft: Secondary | ICD-10-CM | POA: Insufficient documentation

## 2015-12-16 DIAGNOSIS — I129 Hypertensive chronic kidney disease with stage 1 through stage 4 chronic kidney disease, or unspecified chronic kidney disease: Secondary | ICD-10-CM | POA: Diagnosis not present

## 2015-12-16 DIAGNOSIS — N189 Chronic kidney disease, unspecified: Secondary | ICD-10-CM | POA: Insufficient documentation

## 2015-12-16 DIAGNOSIS — E669 Obesity, unspecified: Secondary | ICD-10-CM | POA: Insufficient documentation

## 2015-12-16 DIAGNOSIS — M25512 Pain in left shoulder: Secondary | ICD-10-CM | POA: Insufficient documentation

## 2015-12-16 DIAGNOSIS — E1122 Type 2 diabetes mellitus with diabetic chronic kidney disease: Secondary | ICD-10-CM | POA: Insufficient documentation

## 2015-12-16 DIAGNOSIS — M546 Pain in thoracic spine: Secondary | ICD-10-CM | POA: Insufficient documentation

## 2015-12-16 DIAGNOSIS — S91302A Unspecified open wound, left foot, initial encounter: Secondary | ICD-10-CM | POA: Diagnosis not present

## 2015-12-16 DIAGNOSIS — M810 Age-related osteoporosis without current pathological fracture: Secondary | ICD-10-CM | POA: Insufficient documentation

## 2015-12-16 DIAGNOSIS — M25562 Pain in left knee: Secondary | ICD-10-CM | POA: Diagnosis not present

## 2015-12-16 DIAGNOSIS — I252 Old myocardial infarction: Secondary | ICD-10-CM | POA: Diagnosis not present

## 2015-12-16 DIAGNOSIS — Z9889 Other specified postprocedural states: Secondary | ICD-10-CM | POA: Insufficient documentation

## 2015-12-16 DIAGNOSIS — F329 Major depressive disorder, single episode, unspecified: Secondary | ICD-10-CM | POA: Insufficient documentation

## 2015-12-16 DIAGNOSIS — X58XXXA Exposure to other specified factors, initial encounter: Secondary | ICD-10-CM | POA: Diagnosis not present

## 2015-12-16 DIAGNOSIS — K219 Gastro-esophageal reflux disease without esophagitis: Secondary | ICD-10-CM | POA: Insufficient documentation

## 2015-12-16 DIAGNOSIS — F411 Generalized anxiety disorder: Secondary | ICD-10-CM | POA: Insufficient documentation

## 2015-12-16 MED ORDER — LIDOCAINE 5 % EX OINT: TOPICAL_OINTMENT | CUTANEOUS | Status: DC

## 2015-12-16 MED ORDER — OXYCODONE HCL 15 MG PO TABS: 15.0000 mg | ORAL_TABLET | Freq: Three times a day (TID) | ORAL | Status: DC | PRN

## 2015-12-16 MED ORDER — OXYCODONE HCL 5 MG PO TABS: 5.0000 mg | ORAL_TABLET | Freq: Three times a day (TID) | ORAL | Status: DC | PRN

## 2015-12-16 MED ORDER — NALOXONE HCL 4 MG/0.1ML NA LIQD: 1.0000 | Freq: Once | NASAL | Status: AC

## 2015-12-16 NOTE — Patient Instructions (Addendum)
GENERAL RISKS AND COMPLICATIONS  What are the risk, side effects and possible complications? Generally speaking, most procedures are safe.  However, with any procedure there are risks, side effects, and the possibility of complications.  The risks and complications are dependent upon the sites that are lesioned, or the type of nerve block to be performed.  The closer the procedure is to the spine, the more serious the risks are.  Great care is taken when placing the radio frequency needles, block needles or lesioning probes, but sometimes complications can occur.  Infection: Any time there is an injection through the skin, there is a risk of infection.  This is why sterile conditions are used for these blocks.  There are four possible types of infection.  Localized skin infection.  Central Nervous System Infection-This can be in the form of Meningitis, which can be deadly.  Epidural Infections-This can be in the form of an epidural abscess, which can cause pressure inside of the spine, causing compression of the spinal cord with subsequent paralysis. This would require an emergency surgery to decompress, and there are no guarantees that the patient would recover from the paralysis.  Discitis-This is an infection of the intervertebral discs.  It occurs in about 1% of discography procedures.  It is difficult to treat and it may lead to surgery.        2. Pain: the needles have to go through skin and soft tissues, will cause soreness.       3. Damage to internal structures:  The nerves to be lesioned may be near blood vessels or    other nerves which can be potentially damaged.       4. Bleeding: Bleeding is more common if the patient is taking blood thinners such as  aspirin, Coumadin, Ticiid, Plavix, etc., or if he/she have some genetic predisposition  such as hemophilia. Bleeding into the spinal canal can cause compression of the spinal  cord with subsequent paralysis.  This would require an  emergency surgery to  decompress and there are no guarantees that the patient would recover from the  paralysis.       5. Pneumothorax:  Puncturing of a lung is a possibility, every time a needle is introduced in  the area of the chest or upper back.  Pneumothorax refers to free air around the  collapsed lung(s), inside of the thoracic cavity (chest cavity).  Another two possible  complications related to a similar event would include: Hemothorax and Chylothorax.   These are variations of the Pneumothorax, where instead of air around the collapsed  lung(s), you may have blood or chyle, respectively.       6. Spinal headaches: They may occur with any procedures in the area of the spine.       7. Persistent CSF (Cerebro-Spinal Fluid) leakage: This is a rare problem, but may occur  with prolonged intrathecal or epidural catheters either due to the formation of a fistulous  track or a dural tear.       8. Nerve damage: By working so close to the spinal cord, there is always a possibility of  nerve damage, which could be as serious as a permanent spinal cord injury with  paralysis.       9. Death:  Although rare, severe deadly allergic reactions known as "Anaphylactic  reaction" can occur to any of the medications used.      10. Worsening of the symptoms:  We can always make thing worse.    What are the chances of something like this happening? Chances of any of this occuring are extremely low.  By statistics, you have more of a chance of getting killed in a motor vehicle accident: while driving to the hospital than any of the above occurring .  Nevertheless, you should be aware that they are possibilities.  In general, it is similar to taking a shower.  Everybody knows that you can slip, hit your head and get killed.  Does that mean that you should not shower again?  Nevertheless always keep in mind that statistics do not mean anything if you happen to be on the wrong side of them.  Even if a procedure has a 1  (one) in a 1,000,000 (million) chance of going wrong, it you happen to be that one..Also, keep in mind that by statistics, you have more of a chance of having something go wrong when taking medications.  Who should not have this procedure? If you are on a blood thinning medication (e.g. Coumadin, Plavix, see list of "Blood Thinners"), or if you have an active infection going on, you should not have the procedure.  If you are taking any blood thinners, please inform your physician.  How should I prepare for this procedure?  Do not eat or drink anything at least six hours prior to the procedure.  Bring a driver with you .  It cannot be a taxi.  Come accompanied by an adult that can drive you back, and that is strong enough to help you if your legs get weak or numb from the local anesthetic.  Take all of your medicines the morning of the procedure with just enough water to swallow them.  If you have diabetes, make sure that you are scheduled to have your procedure done first thing in the morning, whenever possible.  If you have diabetes, take only half of your insulin dose and notify our nurse that you have done so as soon as you arrive at the clinic.  If you are diabetic, but only take blood sugar pills (oral hypoglycemic), then do not take them on the morning of your procedure.  You may take them after you have had the procedure.  Do not take aspirin or any aspirin-containing medications, at least eleven (11) days prior to the procedure.  They may prolong bleeding.  Wear loose fitting clothing that may be easy to take off and that you would not mind if it got stained with Betadine or blood.  Do not wear any jewelry or perfume  Remove any nail coloring.  It will interfere with some of our monitoring equipment.  NOTE: Remember that this is not meant to be interpreted as a complete list of all possible complications.  Unforeseen problems may occur.  BLOOD THINNERS The following drugs  contain aspirin or other products, which can cause increased bleeding during surgery and should not be taken for 2 weeks prior to and 1 week after surgery.  If you should need take something for relief of minor pain, you may take acetaminophen which is found in Tylenol,m Datril, Anacin-3 and Panadol. It is not blood thinner. The products listed below are.  Do not take any of the products listed below in addition to any listed on your instruction sheet.  A.P.C or A.P.C with Codeine Codeine Phosphate Capsules #3 Ibuprofen Ridaura  ABC compound Congesprin Imuran rimadil  Advil Cope Indocin Robaxisal  Alka-Seltzer Effervescent Pain Reliever and Antacid Coricidin or Coricidin-D  Indomethacin Rufen    Alka-Seltzer plus Cold Medicine Cosprin Ketoprofen S-A-C Tablets  Anacin Analgesic Tablets or Capsules Coumadin Korlgesic Salflex  Anacin Extra Strength Analgesic tablets or capsules CP-2 Tablets Lanoril Salicylate  Anaprox Cuprimine Capsules Levenox Salocol  Anexsia-D Dalteparin Magan Salsalate  Anodynos Darvon compound Magnesium Salicylate Sine-off  Ansaid Dasin Capsules Magsal Sodium Salicylate  Anturane Depen Capsules Marnal Soma  APF Arthritis pain formula Dewitt's Pills Measurin Stanback  Argesic Dia-Gesic Meclofenamic Sulfinpyrazone  Arthritis Bayer Timed Release Aspirin Diclofenac Meclomen Sulindac  Arthritis pain formula Anacin Dicumarol Medipren Supac  Analgesic (Safety coated) Arthralgen Diffunasal Mefanamic Suprofen  Arthritis Strength Bufferin Dihydrocodeine Mepro Compound Suprol  Arthropan liquid Dopirydamole Methcarbomol with Aspirin Synalgos  ASA tablets/Enseals Disalcid Micrainin Tagament  Ascriptin Doan's Midol Talwin  Ascriptin A/D Dolene Mobidin Tanderil  Ascriptin Extra Strength Dolobid Moblgesic Ticlid  Ascriptin with Codeine Doloprin or Doloprin with Codeine Momentum Tolectin  Asperbuf Duoprin Mono-gesic Trendar  Aspergum Duradyne Motrin or Motrin IB Triminicin  Aspirin  plain, buffered or enteric coated Durasal Myochrisine Trigesic  Aspirin Suppositories Easprin Nalfon Trillsate  Aspirin with Codeine Ecotrin Regular or Extra Strength Naprosyn Uracel  Atromid-S Efficin Naproxen Ursinus  Auranofin Capsules Elmiron Neocylate Vanquish  Axotal Emagrin Norgesic Verin  Azathioprine Empirin or Empirin with Codeine Normiflo Vitamin E  Azolid Emprazil Nuprin Voltaren  Bayer Aspirin plain, buffered or children's or timed BC Tablets or powders Encaprin Orgaran Warfarin Sodium  Buff-a-Comp Enoxaparin Orudis Zorpin  Buff-a-Comp with Codeine Equegesic Os-Cal-Gesic   Buffaprin Excedrin plain, buffered or Extra Strength Oxalid   Bufferin Arthritis Strength Feldene Oxphenbutazone   Bufferin plain or Extra Strength Feldene Capsules Oxycodone with Aspirin   Bufferin with Codeine Fenoprofen Fenoprofen Pabalate or Pabalate-SF   Buffets II Flogesic Panagesic   Buffinol plain or Extra Strength Florinal or Florinal with Codeine Panwarfarin   Buf-Tabs Flurbiprofen Penicillamine   Butalbital Compound Four-way cold tablets Penicillin   Butazolidin Fragmin Pepto-Bismol   Carbenicillin Geminisyn Percodan   Carna Arthritis Reliever Geopen Persantine   Carprofen Gold's salt Persistin   Chloramphenicol Goody's Phenylbutazone   Chloromycetin Haltrain Piroxlcam   Clmetidine heparin Plaquenil   Cllnoril Hyco-pap Ponstel   Clofibrate Hydroxy chloroquine Propoxyphen         Before stopping any of these medications, be sure to consult the physician who ordered them.  Some, such as Coumadin (Warfarin) are ordered to prevent or treat serious conditions such as "deep thrombosis", "pumonary embolisms", and other heart problems.  The amount of time that you may need off of the medication may also vary with the medication and the reason for which you were taking it.  If you are taking any of these medications, please make sure you notify your pain physician before you undergo any  procedures.         Trigger Point Injection Trigger points are areas where you have muscle pain. A trigger point injection is a shot given in the trigger point to relieve that pain. A trigger point might feel like a knot in your muscle. It hurts to press on a trigger point. Sometimes the pain spreads out (radiates) to other parts of the body. For example, pressing on a trigger point in your shoulder might cause pain in your arm or neck. You might have one trigger point. Or, you might have more than one. People often have trigger points in their upper back and lower back. They also occur often in the neck and shoulders. Pain from a trigger point lasts for a long time. It can make   it hard to keep moving. You might not be able to do the exercise or physical therapy that could help you deal with the pain. A trigger point injection may help. It does not work for everyone. But, it may relieve your pain for a few days or a few months. A trigger point injection does not cure long-lasting (chronic) pain. LET YOUR CAREGIVER KNOW ABOUT:  Any allergies (especially to latex, lidocaine, or steroids).  Blood-thinning medicines that you take. These drugs can lead to bleeding or bruising after an injection. They include:  Aspirin.  Ibuprofen.  Clopidogrel.  Warfarin.  Other medicines you take. This includes all vitamins, herbs, eyedrops, over-the-counter medicines, and creams.  Use of steroids.  Recent infections.  Past problems with numbing medicines.  Bleeding problems.  Surgeries you have had.  Other health problems. RISKS AND COMPLICATIONS A trigger point injection is a safe treatment. However, problems may develop, such as:  Minor side effects usually go away in 1 to 2 days. These may include:  Soreness.  Bruising.  Stiffness.  More serious problems are rare. But, they may include:  Bleeding under the skin (hematoma).  Skin infection.  Breaking off of the needle under your  skin.  Lung puncture.  The trigger point injection may not work for you. BEFORE THE PROCEDURE You may need to stop taking any medicine that thins your blood. This is to prevent bleeding and bruising. Usually these medicines are stopped several days before the injection. No other preparation is needed. PROCEDURE  A trigger point injection can be given in your caregiver's office or in a clinic. Each injection takes 2 minutes or less.  Your caregiver will feel for trigger points. The caregiver may use a marker to circle the area for the injection.  The skin over the trigger point will be washed with a germ-killing (antiseptic) solution.  The caregiver pinches the spot for the injection.  Then, a very thin needle is used for the shot. You may feel pain or a twitching feeling when the needle enters the trigger point.  A numbing solution may be injected into the trigger point. Sometimes a drug to keep down swelling, redness, and warmth (inflammation) is also injected.  Your caregiver moves the needle around the trigger zone until the tightness and twitching goes away.  After the injection, your caregiver may put gentle pressure over the injection site.  Then it is covered with a bandage. AFTER THE PROCEDURE  You can go right home after the injection.  The bandage can be taken off after a few hours.  You may feel sore and stiff for 1 to 2 days.  Go back to your regular activities slowly. Your caregiver may ask you to stretch your muscles. Do not do anything that takes extra energy for a few days.  Follow your caregiver's instructions to manage and treat other pain.   This information is not intended to replace advice given to you by your health care provider. Make sure you discuss any questions you have with your health care provider.   Document Released: 08/10/2011 Document Revised: 12/16/2012 Document Reviewed: 08/10/2011 Else[employer] Interactive Patient Education 2016 [employer]  What are the risk, side effects and possible complications? Generally speaking, most procedures are safe.  However, with any procedure there are risks, side effects, and the possibility of complications.  The risks and complications are dependent upon the sites that are lesioned, or the type of nerve block to be performed.  The closer the procedure is to the spine, the more serious the risks are.  Great care is taken when placing the radio frequency needles, block needles or lesioning probes, but sometimes complications can occur.  Infection: Any time there is an injection through the skin, there is a risk of infection.  This is why sterile conditions are used for these blocks.  There are four possible types of infection.  Localized skin infection.  Central Nervous System Infection-This can be in the form of Meningitis, which can be deadly.  Epidural Infections-This can be in the form of an epidural abscess, which can cause pressure inside of the spine, causing compression of the spinal cord with subsequent paralysis. This would require an emergency surgery to decompress, and there are no guarantees that the patient would recover from the paralysis.  Discitis-This is an infection of the intervertebral discs.  It occurs in about 1% of discography procedures.  It is difficult to treat and it may lead to surgery.        2. Pain: the needles have to go through skin and soft tissues, will cause soreness.       3. Damage to internal structures:  The nerves to be lesioned may be near blood vessels or    other nerves which can be potentially damaged.       4. Bleeding: Bleeding is more common if the patient is taking blood thinners such as  aspirin, Coumadin, Ticiid, Plavix, etc., or if he/she have some genetic predisposition  such as hemophilia. Bleeding into the spinal canal can cause compression of the spinal  cord with subsequent paralysis.  This would require an  emergency surgery to  decompress and there are no guarantees that the patient would recover from the  paralysis.       5. Pneumothorax:  Puncturing of a lung is a possibility, every time a needle is introduced in  the area of the chest or upper back.  Pneumothorax refers to free air around the  collapsed lung(s), inside of the thoracic cavity (chest cavity).  Another two possible  complications related to a similar event would include: Hemothorax and Chylothorax.   These are variations of the Pneumothorax, where instead of air around the collapsed  lung(s), you may have blood or chyle, respectively.       6. Spinal headaches: They may occur with any procedures in the area of the spine.       7. Persistent CSF (Cerebro-Spinal Fluid) leakage: This is a rare problem, but may occur  with prolonged intrathecal or epidural catheters either due to the formation of a fistulous  track or a dural tear.       8. Nerve damage: By working so close to the spinal cord, there is always a possibility of  nerve damage, which could be as serious as a permanent spinal cord injury with  paralysis.       9. Death:  Although rare, severe deadly allergic reactions known as "Anaphylactic  reaction" can occur to any of the medications used.      10. Worsening of the symptoms:  We can always make thing worse.  What are the chances of something like this happening? Chances of any of this occuring are extremely low.  By statistics, you have more of a chance of getting killed in a motor vehicle accident: while driving to the hospital than any of the above occurring .  Nevertheless, you should be aware that they are possibilities.  In general, it is similar to taking a shower.  Everybody knows that you can slip, hit your head and get killed.  Does that mean that you should not shower again?  Nevertheless always keep in mind that statistics do not mean anything if you happen to be on the wrong side of them.  Even if a procedure has a 1  (one) in a 1,000,000 (million) chance of going wrong, it you happen to be that one..Also, keep in mind that by statistics, you have more of a chance of having something go wrong when taking medications.  Who should not have this procedure? If you are on a blood thinning medication (e.g. Coumadin, Plavix, see list of "Blood Thinners"), or if you have an active infection going on, you should not have the procedure.  If you are taking any blood thinners, please inform your physician.  How should I prepare for this procedure?  Do not eat or drink anything at least six hours prior to the procedure.  Bring a driver with you .  It cannot be a taxi.  Come accompanied by an adult that can drive you back, and that is strong enough to help you if your legs get weak or numb from the local anesthetic.  Take all of your medicines the morning of the procedure with just enough water to swallow them.  If you have diabetes, make sure that you are scheduled to have your procedure done first thing in the morning, whenever possible.  If you have diabetes, take only half of your insulin dose and notify our nurse that you have done so as soon as you arrive at the clinic.  If you are diabetic, but only take blood sugar pills (oral hypoglycemic), then do not take them on the morning of your procedure.  You may take them after you have had the procedure.  Do not take aspirin or any aspirin-containing medications, at least eleven (11) days prior to the procedure.  They may prolong bleeding.  Wear loose fitting clothing that may be easy to take off and that you would not mind if it got stained with Betadine or blood.  Do not wear any jewelry or perfume  Remove any nail coloring.  It will interfere with some of our monitoring equipment.  NOTE: Remember that this is not meant to be interpreted as a complete list of all possible complications.  Unforeseen problems may occur.  BLOOD THINNERS The following drugs  contain aspirin or other products, which can cause increased bleeding during surgery and should not be taken for 2 weeks prior to and 1 week after surgery.  If you should need take something for relief of minor pain, you may take acetaminophen which is found in Tylenol,m Datril, Anacin-3 and Panadol. It is not blood thinner. The products listed below are.  Do not take any of the products listed below in addition to any listed on your instruction sheet.  A.P.C or A.P.C with Codeine Codeine Phosphate Capsules #3 Ibuprofen Ridaura  ABC compound Congesprin Imuran rimadil  Advil Cope Indocin Robaxisal  Alka-Seltzer Effervescent Pain Reliever and Antacid Coricidin or Coricidin-D  Indomethacin Rufen  Alka-Seltzer plus Cold Medicine Cosprin Ketoprofen S-A-C Tablets  Anacin Analgesic Tablets or Capsules Coumadin Korlgesic Salflex  Anacin Extra Strength Analgesic tablets or capsules CP-2 Tablets Lanoril Salicylate  Anaprox Cuprimine Capsules Levenox Salocol  Anexsia-D Dalteparin Magan Salsalate  Anodynos Darvon compound Magnesium Salicylate Sine-off  Ansaid Dasin Capsules Magsal Sodium Salicylate  Anturane Depen Capsules  Marnal Soma  APF Arthritis pain formula Dewitt's Pills Measurin Stanback  Argesic Dia-Gesic Meclofenamic Sulfinpyrazone  Arthritis Bayer Timed Release Aspirin Diclofenac Meclomen Sulindac  Arthritis pain formula Anacin Dicumarol Medipren Supac  Analgesic (Safety coated) Arthralgen Diffunasal Mefanamic Suprofen  Arthritis Strength Bufferin Dihydrocodeine Mepro Compound Suprol  Arthropan liquid Dopirydamole Methcarbomol with Aspirin Synalgos  ASA tablets/Enseals Disalcid Micrainin Tagament  Ascriptin Doan's Midol Talwin  Ascriptin A/D Dolene Mobidin Tanderil  Ascriptin Extra Strength Dolobid Moblgesic Ticlid  Ascriptin with Codeine Doloprin or Doloprin with Codeine Momentum Tolectin  Asperbuf Duoprin Mono-gesic Trendar  Aspergum Duradyne Motrin or Motrin IB Triminicin  Aspirin  plain, buffered or enteric coated Durasal Myochrisine Trigesic  Aspirin Suppositories Easprin Nalfon Trillsate  Aspirin with Codeine Ecotrin Regular or Extra Strength Naprosyn Uracel  Atromid-S Efficin Naproxen Ursinus  Auranofin Capsules Elmiron Neocylate Vanquish  Axotal Emagrin Norgesic Verin  Azathioprine Empirin or Empirin with Codeine Normiflo Vitamin E  Azolid Emprazil Nuprin Voltaren  Bayer Aspirin plain, buffered or children's or timed BC Tablets or powders Encaprin Orgaran Warfarin Sodium  Buff-a-Comp Enoxaparin Orudis Zorpin  Buff-a-Comp with Codeine Equegesic Os-Cal-Gesic   Buffaprin Excedrin plain, buffered or Extra Strength Oxalid   Bufferin Arthritis Strength Feldene Oxphenbutazone   Bufferin plain or Extra Strength Feldene Capsules Oxycodone with Aspirin   Bufferin with Codeine Fenoprofen Fenoprofen Pabalate or Pabalate-SF   Buffets II Flogesic Panagesic   Buffinol plain or Extra Strength Florinal or Florinal with Codeine Panwarfarin   Buf-Tabs Flurbiprofen Penicillamine   Butalbital Compound Four-way cold tablets Penicillin   Butazolidin Fragmin Pepto-Bismol   Carbenicillin Geminisyn Percodan   Carna Arthritis Reliever Geopen Persantine   Carprofen Gold's salt Persistin   Chloramphenicol Goody's Phenylbutazone   Chloromycetin Haltrain Piroxlcam   Clmetidine heparin Plaquenil   Cllnoril Hyco-pap Ponstel   Clofibrate Hydroxy chloroquine Propoxyphen         Before stopping any of these medications, be sure to consult the physician who ordered them.  Some, such as Coumadin (Warfarin) are ordered to prevent or treat serious conditions such as "deep thrombosis", "pumonary embolisms", and other heart problems.  The amount of time that you may need off of the medication may also vary with the medication and the reason for which you were taking it.  If you are taking any of these medications, please make sure you notify your pain physician before you undergo any  procedures.

## 2015-12-16 NOTE — Progress Notes (Signed)
Safety precautions to be maintained throughout the outpatient stay will include: orient to surroundings, keep bed in low position, maintain call bell within reach at all times, provide assistance with transfer out of bed and ambulation. Oxycodone 5 mg pill count # 10/90  Filled 11-19-15 Oxycodone 15 mg   310/90  Filled 11-19-15

## 2015-12-16 NOTE — Progress Notes (Signed)
Patient's Name: Bryan Merritt  Patient type: Established  MRN: [id number]  Service setting: Ambulatory outpatient  DOB: 1936-07-06  Location: [facility]  DOS: 12/16/2015  Primary Care Physician: Shella Spearing, MD  Note by: Kathlen Brunswick. Dossie Arbour, M.D, DABA, DABAPM, DABPM, DABIPP, FIPP  Referring Physician: Shella Spearing, MD  Specialty: Board-Certified Interventional Pain Management     Primary Reason(s) for Visit: Encounter for prescription drug management (Level of risk: moderate) CC: Shoulder Pain and Back Pain   HPI  Bryan Merritt is a 80 y.o. year old, male patient, who returns today as an established patient. He has Encounter for therapeutic drug level monitoring; Long term current use of opiate analgesic; Opiate use (90 MME/Day); Chronic pain syndrome; Actinic keratoses; Atrial fibrillation ([place]); Borderline glaucoma; Benign fibroma of prostate; Arteriosclerosis of coronary artery; Calcific shoulder tendinitis; Type 2 diabetes mellitus with diabetic chronic kidney disease ([place]); Chronic obstructive pulmonary disease ([place]); Diabetic macular edema ([place]); Gastro-esophageal reflux disease without esophagitis; Type 2 diabetes mellitus ([place]); H/O neoplasm; Hypercholesterolemia; Benign hypertension; Adult hypothyroidism; Other reduced mobility; Lichenification and lichen simplex chronicus; Recurrent major depression in remission ([place]); Moderate nonproliferative diabetic retinopathy ([place]); Obstructive apnea; H/O malignant neoplasm of skin; Other sexual dysfunction not due to a substance or known physiological condition; Systolic heart failure ([place]); Proliferative diabetic retinopathy ([place]); Chronic shoulder pain (Location of Primary Source of Pain) (Bilateral) (R>L); Chronic low back pain; Chronic upper back pain; Chronic knee pain (Bilateral); Legally blind; History of knee surgery; Obesity, Class II, BMI 35-39.9; Diabetic peripheral neuropathy ([place]); Hx of long term use of blood  thinners; Generalized anxiety disorder; Depression; History of MI (myocardial infarction); History of DVT (deep vein thrombosis); Ex-smoker; Emphysema lung ([place]); Chronic pain; Long term prescription opiate use; Encounter for chronic pain management; Breathlessness on exertion; Neurogenic pain; Neuropathic pain; Senile osteoporosis; Prostatic hypertrophy; Urinary anomaly; At high risk for falls; At risk for falling; and Open wound of heel on his problem list.. His primarily concern today is the Shoulder Pain and Back Pain   Pain Assessment: Self-Reported Pain Score: 3  Reported level is compatible with observation Pain Type: Chronic pain Pain Location: Shoulder Pain Orientation: Right, Left Pain Descriptors / Indicators:  (Feels like it is a ripping pain) Pain Frequency: Constant  The patient comes in today clinics today for pharmacological management of his chronic pain.  Date of Last Visit: 09/20/15 Service Provided on Last Visit: Med Refill  Controlled Substance Pharmacotherapy Assessment  Analgesic: Oxycodone IR 15 mg every 8 hours (45 mg/day) + oxycodone IR 5 mg every 8 hours (15 mg/day) (total 60 mg/day) Pill Count: Oxycodone 5 mg pill count # 10/90 Filled 11-19-15. Oxycodone 15 mg 310/90 Filled 11-19-15 MME/day: 90 mg/day.  Pharmacokinetics: Onset of action (Liberation/Absorption): Within expected pharmacological parameters Time to Peak effect (Distribution): Timing and results are as within normal expected parameters Duration of action (Metabolism/Excretion): Within normal limits for medication Pharmacodynamics: Analgesic Effect: More than 50% Activity Facilitation: Medication(s) allow patient to sit, stand, walk, and do the basic ADLs Perceived Effectiveness: Described as relatively effective, allowing for increase in activities of daily living (ADL) Side-effects or Adverse reactions: None reported Monitoring: [place] PMP: Online review of the past 41-month period conducted.  Compliant with practice rules and regulations UDS Results/interpretation: Due to the patient's difficulty voiding the last drug screening tests was done IV on 09/20/2015 and it came back within normal limits. Medication Assessment Form: Reviewed. Patient indicates being compliant with therapy Treatment compliance: Compliant Risk Assessment: Aberrant  Behavior: None observed today Substance Use Disorder (SUD) Risk Level: Low Risk of opioid abuse or dependence: 0.7-3.0% with doses ? 36 MME/day and 6.1-26% with doses ? 120 MME/day. Opioid Risk Tool (ORT) Score: Total Score: 0 Low Risk for SUD (Score <3) Depression Scale Score: PHQ-2: PHQ-2 Total Score: 0 No depression (0) PHQ-9: PHQ-9 Total Score: 0 No depression (0-4)  Pharmacologic Plan: No change in therapy, at this time  Laboratory Chemistry  Inflammation Markers Lab Results  Component Value Date   ESRSEDRATE 37* 09/20/2015   CRP 0.6 09/20/2015    Renal Function Lab Results  Component Value Date   BUN 32* 09/20/2015   CREATININE 1.48* 09/20/2015   GFRAA 51* 09/20/2015   GFRNONAA 44* 09/20/2015    Hepatic Function Lab Results  Component Value Date   AST 16 09/20/2015   ALT 17 09/20/2015   ALBUMIN 3.9 09/20/2015    Electrolytes Lab Results  Component Value Date   NA 136 09/20/2015   K 3.8 09/20/2015   CL 95* 09/20/2015   CALCIUM 8.6* 09/20/2015   MG 2.1 09/20/2015    Pain Modulating Vitamins Lab Results  Component Value Date   VD125OH2TOT 31.2 09/20/2015   VITAMINB12 368 09/20/2015    Coagulation Parameters Lab Results  Component Value Date   INR 1.7 07/31/2012   LABPROT 20.2* 07/31/2012    Note: I personally reviewed the above data. Results shared with patient. Patient informed of the renal problems. The patient was instructed to pursue a follow-up with his primary care physician.  Meds  The patient has a current medication list which includes the following prescription(s): albuterol, alprazolam,  aspirin, atorvastatin, furosemide, insulin glargine, insulin regular, ipratropium, magnesium oxide, metoprolol succinate, nitroglycerin, omeprazole, oxycodone, oxycodone, oxycodone, oxycodone, oxycodone, oxycodone, polyethylene glycol, tamsulosin, terconazole, warfarin, warfarin, lidocaine, and naloxone hcl.  Current Outpatient Prescriptions on File Prior to Visit  Medication Sig  . albuterol (PROVENTIL HFA;VENTOLIN HFA) 108 (90 Base) MCG/ACT inhaler Inhale 2 puffs into the lungs 3 (three) times daily.  Marland Kitchen ALPRAZolam (XANAX) 0.5 MG tablet Take 0.5 mg by mouth at bedtime as needed for anxiety.  Marland Kitchen aspirin 81 MG tablet Take 81 mg by mouth daily.  Marland Kitchen atorvastatin (LIPITOR) 40 MG tablet Take 40 mg by mouth daily.  . furosemide (LASIX) 40 MG tablet Take 120 mg by mouth 2 (two) times daily.   . insulin glargine (LANTUS) 100 UNIT/ML injection Inject 40 Units into the skin 3 (three) times daily.  . insulin regular (NOVOLIN R,HUMULIN R) 100 units/mL injection Inject 20 Units into the skin 3 (three) times daily before meals.  Marland Kitchen ipratropium (ATROVENT HFA) 17 MCG/ACT inhaler Inhale 2 puffs into the lungs 2 (two) times daily.  . magnesium oxide (MAG-OX) 400 MG tablet Take 400 mg by mouth daily.  . metoprolol succinate (TOPROL-XL) 100 MG 24 hr tablet Take 100 mg by mouth daily. Take with or immediately following a meal.  . nitroGLYCERIN (NITROSTAT) 0.4 MG SL tablet Place 0.4 mg under the tongue every 5 (five) minutes as needed for chest pain.  Marland Kitchen omeprazole (PRILOSEC) 20 MG capsule Take 20 mg by mouth 2 (two) times daily before a meal.  . polyethylene glycol (MIRALAX / GLYCOLAX) packet Take 17 g by mouth daily.  . tamsulosin (FLOMAX) 0.4 MG CAPS capsule Take 0.4 mg by mouth.  . terconazole (TERAZOL 7) 0.4 % vaginal cream Place 1 applicator vaginally at bedtime.  Marland Kitchen warfarin (COUMADIN) 2 MG tablet Take 2 mg by mouth daily. wednesdays  . warfarin (  COUMADIN) 3 MG tablet Take 3 mg by mouth daily.   No current  facility-administered medications on file prior to visit.    ROS  Constitutional: Afebrile, no chills, well hydrated and well nourished Gastrointestinal: No upper or lower GI bleeding, no nausea, no vomiting and no acute GI distress Musculoskeletal: No acute joint swelling or redness, no acute loss of range of motion and no acute onset weakness Neurological: Denies any acute onset apraxia, no episodes of paralysis, no acute loss of coordination, no acute loss of consciousness and no acute onset aphasia, dysarthria, agnosia, or amnesia  Allergies  Mr. Buhrman is allergic to lisinopril.  [place]  Medical:  Mr. Brossman  has a past medical history of Hypercholesteremia; CAD (coronary artery disease); MI (myocardial infarction) ([place]); Cardiac arrhythmia; Emphysema of lung ([place]); Asthma; Pancreatitis; Gallbladder disease; Diabetes mellitus without complication ([place]); Spine disorder; Muscle disorder; Depression; Anxiety; Cancer ([place]) (2016); History of DVT (deep vein thrombosis) (06/21/2015); History of MI (myocardial infarction) (06/21/2015); Ex-smoker (06/21/2015); and Emphysema lung ([place]) (06/21/2015). Family: family history includes Alcohol abuse in his father; Cancer in his mother; Diabetes in his mother; Heart disease in his father. Surgical:  has past surgical history that includes cardiac stents; Cholecystectomy; Appendectomy; Back surgery; and Knee surgery (Bilateral). Tobacco:  reports that he has never smoked. He does not have any smokeless tobacco history on file. Alcohol:  has no alcohol history on file. Drug:  has no drug history on file.  Physical Examination  Constitutional Vitals:  Today's Vitals   12/16/15 1442 12/16/15 1443  BP:  127/51  Pulse: 69   Temp: 98.5 F (36.9 C)   Resp: 18   Height: 5\' 9"  (1.753 m)   Weight: 249 lb (112.946 kg)   SpO2: 95%   PainSc: 3  3   PainLoc: Shoulder    Calculated BMI: Body mass index is 36.75 kg/(m^2).    General appearance: alert,  cooperative, oriented, in no distress, moderately obese, well nourished and well hydrated Eyes: The patient is legally blind. Respiratory: No evidence respiratory distress, no audible rales or ronchi and no use of accessory muscles of respiration Psych: Alert, oriented to person, oriented to place and oriented to time  Cervical Spine Exam  Inspection: Normal anatomy, no anomalies observed Cervical Lordosis: Normal Alignment: Symetrical Functional ROM: Within functional limits [facility]) AROM: WFL Sensory: No sensory anomalies reported or detected  Upper Extremity Exam    Right  Left  Inspection: No gross anomalies detected  Inspection: No gross anomalies detected  Functional ROM: Adequate  Functional ROM: Adequate  AROM: Decreased for the shoulders   AROM: Decreased for the shoulders   Sensory: No sensory anomalies reported or detected  Sensory: No sensory anomalies reported or detected  Motor: Unremarkable  Motor: Unremarkable  Vascular: Normal skin color, temperature, and hair growth. No peripheral edema or cyanosis  Vascular: Normal skin color, temperature, and hair growth. No peripheral edema or cyanosis   Thoracic Spine  Inspection: No gross anomalies detected Alignment: Symetrical Functional ROM: Within functional limits [facility]) AROM: Adequate Palpation: WNL  Lumbar Spine  Inspection: No gross anomalies detected Alignment: Symetrical Functional ROM: Within functional limits (WFL) AROM: Decreased Sensory: No sensory anomalies reported or detected Palpation: WNL Provocative Tests: Lumbar Hyperextension and rotation test: deferred Patrick's Maneuver: deferred  Gait Assessment  Gait: The patient comes in in a wheelchair.  Lower Extremities    Right  Left  Inspection: No gross anomalies detected  Inspection: No gross anomalies detected  Functional ROM: Within  functional limits [facility])  Functional ROM: Within functional limits [facility])  AROM: Decreased  AROM: Decreased  Sensory:  No sensory anomalies reported or detected  Sensory: No sensory anomalies reported or detected   Assessment & Plan  Primary Diagnosis & Pertinent Problem List: The primary encounter diagnosis was Chronic pain. Diagnoses of Encounter for therapeutic drug level monitoring, Long term current use of opiate analgesic, Bilateral shoulder pain (Location of Primary Source of Pain) (Bilateral) (R>L), Opiate use (90 MME/Day), and Chronic shoulder pain, unspecified laterality were also pertinent to this visit.  Visit Diagnosis: 1. Chronic pain   2. Encounter for therapeutic drug level monitoring   3. Long term current use of opiate analgesic   4. Bilateral shoulder pain (Location of Primary Source of Pain) (Bilateral) (R>L)   5. Opiate use (90 MME/Day)   6. Chronic shoulder pain, unspecified laterality     Problems updated and reviewed during this visit: Problem-specific Plan(s): No problem-specific assessment & plan notes found for this encounter.  No new assessment & plan notes have been filed under this hospital service since the last note was generated. Service: Pain Management   Plan of Care   Problem List Items Addressed This Visit      High   Chronic pain - Primary (Chronic)   Relevant Medications   oxyCODONE (OXY IR/ROXICODONE) 5 MG immediate release tablet   oxyCODONE (OXY IR/ROXICODONE) 5 MG immediate release tablet   oxyCODONE (OXY IR/ROXICODONE) 5 MG immediate release tablet   oxyCODONE (ROXICODONE) 15 MG immediate release tablet   oxyCODONE (ROXICODONE) 15 MG immediate release tablet   oxyCODONE (ROXICODONE) 15 MG immediate release tablet     Medium   Encounter for therapeutic drug level monitoring   Long term current use of opiate analgesic (Chronic)   Relevant Orders   Drug Screen 10 W/Conf, Serum   Opiate use (90 MME/Day) (Chronic)   Relevant Medications   Naloxone HCl (NARCAN) 4 MG/0.1ML LIQD    Other Visit Diagnoses    Bilateral shoulder pain (Location of Primary  Source of Pain) (Bilateral) (R>L)  (Chronic)       Relevant Medications    lidocaine (XYLOCAINE) 5 % ointment    Other Relevant Orders    SHOULDER INJECTION    Chronic shoulder pain, unspecified laterality  (Chronic)           Pharmacotherapy (Medications Ordered): Meds ordered this encounter  Medications  . oxyCODONE (OXY IR/ROXICODONE) 5 MG immediate release tablet    Sig: Take 1 tablet (5 mg total) by mouth every 8 (eight) hours as needed for moderate pain or severe pain.    Dispense:  90 tablet    Refill:  0    Do not place this medication, or any other prescription from our practice, on "Automatic Refill". Patient may have prescription filled one day early if pharmacy is closed on scheduled refill date. Do not fill until: 12/16/15 To last until: 01/15/16  . oxyCODONE (OXY IR/ROXICODONE) 5 MG immediate release tablet    Sig: Take 1 tablet (5 mg total) by mouth every 8 (eight) hours as needed for moderate pain or severe pain.    Dispense:  90 tablet    Refill:  0    Do not place this medication, or any other prescription from our practice, on "Automatic Refill". Patient may have prescription filled one day early if pharmacy is closed on scheduled refill date. Do not fill until: 01/15/16 To last until: 02/14/16  . oxyCODONE (OXY IR/ROXICODONE) 5 MG  immediate release tablet    Sig: Take 1 tablet (5 mg total) by mouth every 8 (eight) hours as needed for moderate pain or severe pain.    Dispense:  90 tablet    Refill:  0    Do not place this medication, or any other prescription from our practice, on "Automatic Refill". Patient may have prescription filled one day early if pharmacy is closed on scheduled refill date. Do not fill until: 02/14/16 To last until: 03/15/16  . oxyCODONE (ROXICODONE) 15 MG immediate release tablet    Sig: Take 1 tablet (15 mg total) by mouth every 8 (eight) hours as needed for pain.    Dispense:  90 tablet    Refill:  0    Do not place this medication,  or any other prescription from our practice, on "Automatic Refill". Patient may have prescription filled one day early if pharmacy is closed on scheduled refill date. Do not fill until: 12/16/15 To last until: 01/15/16  . oxyCODONE (ROXICODONE) 15 MG immediate release tablet    Sig: Take 1 tablet (15 mg total) by mouth every 8 (eight) hours as needed for pain.    Dispense:  90 tablet    Refill:  0    Do not place this medication, or any other prescription from our practice, on "Automatic Refill". Patient may have prescription filled one day early if pharmacy is closed on scheduled refill date. Do not fill until: 01/15/16 To last until: 02/14/16  . oxyCODONE (ROXICODONE) 15 MG immediate release tablet    Sig: Take 1 tablet (15 mg total) by mouth every 8 (eight) hours as needed for pain.    Dispense:  90 tablet    Refill:  0    Do not place this medication, or any other prescription from our practice, on "Automatic Refill". Patient may have prescription filled one day early if pharmacy is closed on scheduled refill date. Do not fill until: 02/14/16 To last until: 03/15/16  . Naloxone HCl (NARCAN) 4 MG/0.1ML LIQD    Sig: Place 1 Bottle into the nose once. , then call 911, repeat if needed in other nostril with new bottle.    Dispense:  2 each    Refill:  0    Please provide the patient with clear instructions on the use of this device/medication.  . lidocaine (XYLOCAINE) 5 % ointment    Sig: Apply topically. Maximum dose: 5 g/application (approximately 6 inches of ointment); 20 g/day    Dispense:  35.44 g    Refill:  2    Do not add this medication to the electronic "Automatic Refill" notification system. Patient may have prescription filled one day early if pharmacy is closed on scheduled refill date.   Lab-work & Procedure Ordered: Orders Placed This Encounter  Procedures  . SHOULDER INJECTION  . Drug Screen 10 W/Conf, Serum    Imaging Ordered: None  Interventional  Therapies: Scheduled:  Bilateral intra-articular shoulder injection under fluoroscopic guidance, no sedation. The patient was instructed to stop the Coumadin 5 days prior to procedure.    Considering:  None at this point.    PRN Procedures:  None at this point.    Referral(s) or Consult(s): None at this time.  New Prescriptions   LIDOCAINE (XYLOCAINE) 5 % OINTMENT    Apply topically. Maximum dose: 5 g/application (approximately 6 inches of ointment); 20 g/day   NALOXONE HCL (NARCAN) 4 MG/0.1ML LIQD    Place 1 Bottle into the nose once. , then  call 911, repeat if needed in other nostril with new bottle.    Medications administered during this visit: Mr. Meyering had no medications administered during this visit.  Future Appointments Date Time Provider [place]  12/23/2015 10:15 AM Milinda Pointer, MD [facility] None  03/15/2016 1:40 PM Milinda Pointer, MD [facility] None    Primary Care Physician: Shella Spearing, MD Location: [facility] Note by: Kathlen Brunswick Dossie Arbour, M.D, DABA, DABAPM, DABPM, DABIPP, FIPP  Pain Score Disclaimer: We use the NRS-11 scale. This is a self-reported, subjective measurement of pain severity with only modest accuracy. It is used primarily to identify changes within a particular patient. It must be understood that outpatient pain scales are significantly less accurate that those used for research, where they can be applied under ideal controlled circumstances with minimal exposure to variables. In reality, the score is likely to be a combination of pain intensity and pain affect, where pain affect describes the degree of emotional arousal or changes in action readiness caused by the sensory experience of pain. Factors such as social and work situation, setting, emotional state, anxiety levels, expectation, and prior pain experience may influence pain perception and show large inter-individual differences that may also be affected  by time variables.

## 2015-12-23 ENCOUNTER — Ambulatory Visit: Payer: Medicare Other | Admitting: Pain Medicine

## 2016-02-28 ENCOUNTER — Other Ambulatory Visit
Admission: RE | Admit: 2016-02-28 | Discharge: 2016-02-28 | Disposition: A | Discharge status: Home / Self Care | Payer: Medicare Other | Source: Ambulatory Visit | Attending: Pain Medicine | Admitting: Pain Medicine

## 2016-02-28 ENCOUNTER — Ambulatory Visit: Payer: Medicare Other | Attending: Pain Medicine | Admitting: Pain Medicine

## 2016-02-28 ENCOUNTER — Encounter: Payer: Self-pay | Admitting: Pain Medicine

## 2016-02-28 VITALS — BP 124/66 | HR 70 | Temp 98.0°F | Resp 18 | Ht 69.0 in | Wt 254.0 lb

## 2016-02-28 DIAGNOSIS — Z79891 Long term (current) use of opiate analgesic: Secondary | ICD-10-CM | POA: Insufficient documentation

## 2016-02-28 DIAGNOSIS — L570 Actinic keratosis: Secondary | ICD-10-CM | POA: Diagnosis not present

## 2016-02-28 DIAGNOSIS — Z7901 Long term (current) use of anticoagulants: Secondary | ICD-10-CM | POA: Insufficient documentation

## 2016-02-28 DIAGNOSIS — L280 Lichen simplex chronicus: Secondary | ICD-10-CM | POA: Insufficient documentation

## 2016-02-28 DIAGNOSIS — Z87891 Personal history of nicotine dependence: Secondary | ICD-10-CM | POA: Diagnosis not present

## 2016-02-28 DIAGNOSIS — E7800 Pure hypercholesterolemia, unspecified: Secondary | ICD-10-CM | POA: Insufficient documentation

## 2016-02-28 DIAGNOSIS — F411 Generalized anxiety disorder: Secondary | ICD-10-CM | POA: Diagnosis not present

## 2016-02-28 DIAGNOSIS — I5022 Chronic systolic (congestive) heart failure: Secondary | ICD-10-CM | POA: Diagnosis not present

## 2016-02-28 DIAGNOSIS — E039 Hypothyroidism, unspecified: Secondary | ICD-10-CM | POA: Diagnosis not present

## 2016-02-28 DIAGNOSIS — J439 Emphysema, unspecified: Secondary | ICD-10-CM | POA: Insufficient documentation

## 2016-02-28 DIAGNOSIS — H4089 Other specified glaucoma: Secondary | ICD-10-CM | POA: Insufficient documentation

## 2016-02-28 DIAGNOSIS — E1140 Type 2 diabetes mellitus with diabetic neuropathy, unspecified: Secondary | ICD-10-CM | POA: Diagnosis not present

## 2016-02-28 DIAGNOSIS — I252 Old myocardial infarction: Secondary | ICD-10-CM | POA: Insufficient documentation

## 2016-02-28 DIAGNOSIS — Z5181 Encounter for therapeutic drug level monitoring: Secondary | ICD-10-CM | POA: Diagnosis not present

## 2016-02-28 DIAGNOSIS — Z86718 Personal history of other venous thrombosis and embolism: Secondary | ICD-10-CM | POA: Diagnosis not present

## 2016-02-28 DIAGNOSIS — I4891 Unspecified atrial fibrillation: Secondary | ICD-10-CM | POA: Insufficient documentation

## 2016-02-28 DIAGNOSIS — N189 Chronic kidney disease, unspecified: Secondary | ICD-10-CM | POA: Diagnosis not present

## 2016-02-28 DIAGNOSIS — M25512 Pain in left shoulder: Secondary | ICD-10-CM

## 2016-02-28 DIAGNOSIS — M7530 Calcific tendinitis of unspecified shoulder: Secondary | ICD-10-CM | POA: Insufficient documentation

## 2016-02-28 DIAGNOSIS — J449 Chronic obstructive pulmonary disease, unspecified: Secondary | ICD-10-CM | POA: Diagnosis not present

## 2016-02-28 DIAGNOSIS — M25519 Pain in unspecified shoulder: Secondary | ICD-10-CM | POA: Diagnosis present

## 2016-02-28 DIAGNOSIS — S91309A Unspecified open wound, unspecified foot, initial encounter: Secondary | ICD-10-CM | POA: Diagnosis not present

## 2016-02-28 DIAGNOSIS — M25511 Pain in right shoulder: Secondary | ICD-10-CM | POA: Insufficient documentation

## 2016-02-28 DIAGNOSIS — E1122 Type 2 diabetes mellitus with diabetic chronic kidney disease: Secondary | ICD-10-CM | POA: Insufficient documentation

## 2016-02-28 DIAGNOSIS — G8929 Other chronic pain: Secondary | ICD-10-CM | POA: Insufficient documentation

## 2016-02-28 DIAGNOSIS — I2510 Atherosclerotic heart disease of native coronary artery without angina pectoris: Secondary | ICD-10-CM | POA: Insufficient documentation

## 2016-02-28 DIAGNOSIS — M549 Dorsalgia, unspecified: Secondary | ICD-10-CM | POA: Diagnosis present

## 2016-02-28 DIAGNOSIS — E11311 Type 2 diabetes mellitus with unspecified diabetic retinopathy with macular edema: Secondary | ICD-10-CM | POA: Diagnosis not present

## 2016-02-28 DIAGNOSIS — D291 Benign neoplasm of prostate: Secondary | ICD-10-CM | POA: Diagnosis not present

## 2016-02-28 DIAGNOSIS — K219 Gastro-esophageal reflux disease without esophagitis: Secondary | ICD-10-CM | POA: Insufficient documentation

## 2016-02-28 DIAGNOSIS — X58XXXA Exposure to other specified factors, initial encounter: Secondary | ICD-10-CM | POA: Insufficient documentation

## 2016-02-28 DIAGNOSIS — I129 Hypertensive chronic kidney disease with stage 1 through stage 4 chronic kidney disease, or unspecified chronic kidney disease: Secondary | ICD-10-CM | POA: Insufficient documentation

## 2016-02-28 DIAGNOSIS — M810 Age-related osteoporosis without current pathological fracture: Secondary | ICD-10-CM | POA: Insufficient documentation

## 2016-02-28 MED ORDER — OXYCODONE HCL 15 MG PO TABS: 15.0000 mg | ORAL_TABLET | Freq: Three times a day (TID) | ORAL | Status: DC | PRN

## 2016-02-28 MED ORDER — OXYCODONE HCL 5 MG PO TABS: 5.0000 mg | ORAL_TABLET | Freq: Three times a day (TID) | ORAL | Status: DC | PRN

## 2016-02-28 NOTE — Progress Notes (Signed)
Safety precautions to be maintained throughout the outpatient stay will include: orient to surroundings, keep bed in low position, maintain call bell within reach at all times, provide assistance with transfer out of bed and ambulation. Oxycodone 5 mg #58/90  Filled 02-16-16 Oxycodone 15 mg # 59/90  Filled 02-16-16

## 2016-02-28 NOTE — Patient Instructions (Addendum)
Patient instructed to go to lab today for serum drug screen.  Prescriptions given for roxicodone x3 and oxycodone IR x3.  Call for procedure when needed. See below for instructions. Trigger Point Injections Patient Information  Description: Trigger points are areas of muscle sensitive to touch which cause pain with movement, sometimes felt some distance from the site of palpation.  Usually the muscle containing these trigger points if felt as a tight band or knot.   The area of maximum tenderness or trigger point is identified, and after antiseptic preparation of the skin, a small needle is placed into this site.  Reproduction of the pain often occurs and numbing medicine (local anesthetic) is injected into the site, sometimes along with steroid preparation.  The entire block usually lasts less than 5 minutes.  Conditions which may be treated by trigger points:   Muscular pain and spasm  Nerve irritation  Preparation for the injection:  1. Do not eat any solid food or dairy products within 8 hours of your appointment. 2. You may drink clear liquids up to 3 hours before appointment.  Clear liquids include water, black coffee, juice or soda.  No milk or cream please. 3. You may take your regular medications, including pain medications, with a sip of water before your appointment.  Diabetics should hold regular insulin ( if take separately) and take 1/2 normal NPH dose the morning of the procedure.  Carry some sugar containing items with you to your appointment. 4. A driver must accompany you and be prepared to drive you home after your procedure.  5. Bring all your current medications with you. 6. An IV may be inserted and sedation may be given at the discretion of the physician.  7. A blood pressure cuff, EKG, and other monitors will often be applied during the procedure.  Some patients may need to have extra oxygen administered for a short period. 8. You will be asked to provide medical  information, including your allergies and medications, prior to the procedure.  We must know immediately if you are taking blood thinners (like Coumadin/Warfarin) or if you are allergic to IV iodine contrast (dye).  We must know if you could possibly be pregnant.  Possible side-effects:   Bleeding from needle site  Infection (rare, may require surgery)  Nerve injury (rare)  Numbness & tingling (temporary)  Punctured lung (if injection around chest)  Light-headedness (temporary)  Pain at injection site (several days)  Decreased blood pressure (rare, temporary)  Weakness in arm/leg (temporary)  Call if you experience:   Hive or difficulty breathing (go to the emergency room)  Inflammation or drainage at the injection site(s)  Please note:  Although the local anesthetic injected can often make your painful muscle feel good for several hours after the injection, the pain may return.  It takes 3-7 days for steroids to work.  You may not notice any pain relief for at least one week.  If effective, we will often do a series of injections spaced 3-6 weeks apart to maximally decrease your pain.  If you have any questions please call [phone] [facility]

## 2016-02-28 NOTE — Progress Notes (Signed)
Patient's Name: Bryan Merritt  Patient type: Established  MRN: [id number]  Service setting: Ambulatory outpatient  DOB: 09/12/1935  Location: [facility]  DOS: 02/28/2016  Primary Care Physician: Shella Spearing, MD  Note by: Kathlen Brunswick. Dossie Arbour, M.D, DABA, DABAPM, DABPM, DABIPP, FIPP  Referring Physician: Shella Spearing, MD  Specialty: Board-Certified Interventional Pain Management  Last Visit to Pain Management: 12/16/2015   Primary Reason(s) for Visit: Encounter for prescription drug management (Level of risk: moderate) CC: Shoulder Pain; Knee Pain; and Back Pain   HPI  Bryan Merritt is a 80 y.o. year old, male patient, who returns today as an established patient. He has Encounter for therapeutic drug level monitoring; Long term current use of opiate analgesic; Opiate use (90 MME/Day); Chronic pain syndrome; Actinic keratoses; Atrial fibrillation ([place]); Borderline glaucoma; Benign fibroma of prostate; Arteriosclerosis of coronary artery; Calcific shoulder tendinitis; Type 2 diabetes mellitus with diabetic chronic kidney disease ([place]); Chronic obstructive pulmonary disease ([place]); Diabetic macular edema ([place]); Gastro-esophageal reflux disease without esophagitis; Type 2 diabetes mellitus ([place]); H/O neoplasm; Hypercholesterolemia; Benign hypertension; Adult hypothyroidism; Other reduced mobility; Lichenification and lichen simplex chronicus; Recurrent major depression in remission ([place]); Moderate nonproliferative diabetic retinopathy ([place]); Obstructive apnea; H/O malignant neoplasm of skin; Other sexual dysfunction not due to a substance or known physiological condition; Systolic heart failure ([place]); Proliferative diabetic retinopathy ([place]); Chronic shoulder pain (Location of Primary Source of Pain) (Bilateral) (R>L); Chronic low back pain; Chronic upper back pain; Chronic knee pain (Bilateral); Legally blind; History of knee surgery; Obesity, Class II, BMI 35-39.9; Diabetic peripheral  neuropathy ([place]); Hx of long term use of blood thinners; Generalized anxiety disorder; Depression; History of MI (myocardial infarction); History of DVT (deep vein thrombosis); Ex-smoker; Emphysema lung ([place]); Chronic pain; Long term prescription opiate use; Encounter for chronic pain management; Breathlessness on exertion; Neurogenic pain; Neuropathic pain; Senile osteoporosis; Prostatic hypertrophy; Urinary anomaly; At high risk for falls; At risk for falling; and Open wound of heel on his problem list.. His primarily concern today is the Shoulder Pain; Knee Pain; and Back Pain   Pain Assessment: Self-Reported Pain Score: 3  Reported level is compatible with observation       Pain Type: Chronic pain Pain Location:  (shoulder, knee, back) Pain Orientation:  (bilateral shoulders, knees and upper back.) Pain Descriptors / Indicators:  (states all of the above) Pain Frequency: Constant  The patient comes into the clinics today for pharmacological management of his chronic pain. I last saw this patient on 12/16/2015. The patient  has no drug history on file. His body mass index is 37.49 kg/(m^2).   The patient comes in today indicating that he had some shoulder injections done at [facility] that seem to be helping him. Injections done by Dr. Earmon Phoenix. Today the patient was oriented to the fact that when we take over the patient's medication management, we would also like to be able to control any and all injection therapies. The patient indicates that he had bilateral shoulder injection done by Dr. Earmon Phoenix that seems to have helped. Today I also spoke to him about several other options and we can offer including diagnostic suprascapular nerve blocks, which if effective, we can follow up with radiofrequency ablation. This technique may provide him with anywhere from 3-18 months of pain relief in the area of the shoulders.  Date of Last Visit: 12/16/15 Service Provided on Last Visit: Med  Refill  Controlled Substance Pharmacotherapy Assessment & REMS (Risk Evaluation  and Mitigation Strategy)  Analgesic: Oxycodone IR 15 mg every 8 hours (45 mg/day) + oxycodone IR 5 mg every 8 hours (15 mg/day) (total 60 mg/day) MME/day: 90 mg/day.  Pill Count: Oxycodone 5 mg #58/90 Filled 02-16-16. Oxycodone 15 mg # 59/90 Filled 02-16-16. Pharmacokinetics: Onset of action (Liberation/Absorption): Within expected pharmacological parameters Time to Peak effect (Distribution): Timing and results are as within normal expected parameters Duration of action (Metabolism/Excretion): Within normal limits for medication Pharmacodynamics: Analgesic Effect: More than 50% Activity Facilitation: Medication(s) allow patient to sit, stand, walk, and do the basic ADLs Perceived Effectiveness: Described as relatively effective, allowing for increase in activities of daily living (ADL) Side-effects or Adverse reactions: None reported Monitoring: [place] PMP: Online review of the past 61-month period conducted. Compliant with practice rules and regulations Medication Assessment Form: Reviewed. Patient indicates being compliant with therapy Treatment compliance: Compliant Risk Assessment: Aberrant Behavior: None observed today Substance Use Disorder (SUD) Risk Level: No change since last visit Risk of opioid abuse or dependence: 0.7-3.0% with doses ? 36 MME/day and 6.1-26% with doses ? 120 MME/day. Opioid Risk Tool (ORT) Score: Total Score: 4 Moderate Risk for SUD (Score between 4-7) Depression Scale Score: PHQ-2: PHQ-2 Total Score: 0 No depression (0) PHQ-9: PHQ-9 Total Score: 0 No depression (0-4)  Pharmacologic Plan: No change in therapy, at this time  Laboratory Chemistry  Inflammation Markers Lab Results  Component Value Date   ESRSEDRATE 37* 09/20/2015   CRP 0.6 09/20/2015    Renal Function Lab Results  Component Value Date   BUN 32* 09/20/2015   CREATININE 1.48* 09/20/2015   GFRAA 51* 09/20/2015    GFRNONAA 44* 09/20/2015    Hepatic Function Lab Results  Component Value Date   AST 16 09/20/2015   ALT 17 09/20/2015   ALBUMIN 3.9 09/20/2015    Electrolytes Lab Results  Component Value Date   NA 136 09/20/2015   K 3.8 09/20/2015   CL 95* 09/20/2015   CALCIUM 8.6* 09/20/2015   MG 2.1 09/20/2015    Pain Modulating Vitamins Lab Results  Component Value Date   VD125OH2TOT 31.2 09/20/2015   VITAMINB12 368 09/20/2015    Coagulation Parameters Lab Results  Component Value Date   INR 1.7 07/31/2012   LABPROT 20.2* 07/31/2012   APTT 30.9 02/06/2012   PLT 194 02/07/2012    Note: Labs Reviewed.  Recent Diagnostic Imaging  Shoulder Imaging: Shoulder-L DG:  Results for orders placed in visit on 11/27/07  DG Shoulder Left   Narrative * PRIOR REPORT IMPORTED FROM AN EXTERNAL SYSTEM *   PRIOR REPORT IMPORTED FROM THE [facility] WORKFLOW SYSTEM   REASON FOR EXAM:    injury fax results [id number]  COMMENTS:   PROCEDURE:     MDR - MDR SHOULDER LEFT COMPLETE  - Nov 27 2007  4:49PM   RESULT:     Comparison: No available comparison exam.   Findings:   Three views of the left shoulder were obtained.   No definite fracture or dislocation of the left shoulder is noted.  Moderate  glenohumeral joint degenerative changes are seen. Mild AC joint  degenerative  changes are seen.   IMPRESSION:   1. No definite fracture or dislocation of the left shoulder.  2. Moderate left glenohumeral joint degenerative changes are seen.   Thank you for this opportunity to contribute to the care of your patient.       Note: Imaging reviewed.  Meds  The patient has a current medication list which  includes the following prescription(s): albuterol, alprazolam, aspirin, atorvastatin, b-d ins syr ultrafine 1cc/31g, bayer contour test, ra blood glucose monitor, furosemide, insulin glargine, insulin regular, insulin syringe-needle u-100, ipratropium, accu-chek multiclix, losartan,  magnesium oxide, metoprolol succinate, naloxone hcl, nitroglycerin, omeprazole, oxycodone, oxycodone, oxycodone, oxycodone, oxycodone, oxycodone, paroxetine, polyethylene glycol, tamsulosin, terconazole, triamcinolone ointment, warfarin, and warfarin.  Current Outpatient Prescriptions on File Prior to Visit  Medication Sig  . albuterol (PROVENTIL HFA;VENTOLIN HFA) 108 (90 Base) MCG/ACT inhaler Inhale 2 puffs into the lungs 3 (three) times daily.  Marland Kitchen ALPRAZolam (XANAX) 0.5 MG tablet Take 0.5 mg by mouth at bedtime as needed for anxiety.  Marland Kitchen aspirin 81 MG tablet Take 81 mg by mouth daily.  Marland Kitchen atorvastatin (LIPITOR) 40 MG tablet Take 40 mg by mouth daily.  . furosemide (LASIX) 40 MG tablet Take 120 mg by mouth 2 (two) times daily.   . insulin glargine (LANTUS) 100 UNIT/ML injection Inject 40 Units into the skin 3 (three) times daily.  . insulin regular (NOVOLIN R,HUMULIN R) 100 units/mL injection Inject 20 Units into the skin 3 (three) times daily before meals.  Marland Kitchen ipratropium (ATROVENT HFA) 17 MCG/ACT inhaler Inhale 2 puffs into the lungs 2 (two) times daily.  . magnesium oxide (MAG-OX) 400 MG tablet Take 400 mg by mouth daily.  . metoprolol succinate (TOPROL-XL) 100 MG 24 hr tablet Take 100 mg by mouth daily. Take with or immediately following a meal.  . Naloxone HCl (NARCAN) 4 MG/0.1ML LIQD Place 1 Bottle into the nose once. , then call 911, repeat if needed in other nostril with new bottle.  . nitroGLYCERIN (NITROSTAT) 0.4 MG SL tablet Place 0.4 mg under the tongue every 5 (five) minutes as needed for chest pain.  Marland Kitchen omeprazole (PRILOSEC) 20 MG capsule Take 20 mg by mouth 2 (two) times daily before a meal.  . polyethylene glycol (MIRALAX / GLYCOLAX) packet Take 17 g by mouth daily.  . tamsulosin (FLOMAX) 0.4 MG CAPS capsule Take 0.8 mg by mouth. Reported on 02/28/2016  . terconazole (TERAZOL 7) 0.4 % vaginal cream Place 1 applicator vaginally at bedtime.  Marland Kitchen warfarin (COUMADIN) 2 MG tablet Take 2 mg by  mouth daily. wednesdays  . warfarin (COUMADIN) 3 MG tablet Take 3 mg by mouth daily.   No current facility-administered medications on file prior to visit.    ROS  Constitutional: Denies any fever or chills Gastrointestinal: No reported hemesis, hematochezia, vomiting, or acute GI distress Musculoskeletal: Denies any acute onset joint swelling, redness, loss of ROM, or weakness Neurological: No reported episodes of acute onset apraxia, aphasia, dysarthria, agnosia, amnesia, paralysis, loss of coordination, or loss of consciousness  Allergies  Mr. Steines is allergic to lisinopril.  [place]  Medical:  Mr. Dingus  has a past medical history of Hypercholesteremia; CAD (coronary artery disease); MI (myocardial infarction) ([place]); Cardiac arrhythmia; Emphysema of lung ([place]); Asthma; Pancreatitis; Gallbladder disease; Diabetes mellitus without complication ([place]); Spine disorder; Muscle disorder; Depression; Anxiety; Cancer ([place]) (2016); History of DVT (deep vein thrombosis) (06/21/2015); History of MI (myocardial infarction) (06/21/2015); Ex-smoker (06/21/2015); and Emphysema lung ([place]) (06/21/2015). Family: family history includes Alcohol abuse in his father; Cancer in his mother; Diabetes in his mother; Heart disease in his father. Surgical:  has past surgical history that includes cardiac stents; Cholecystectomy; Appendectomy; Back surgery; and Knee surgery (Bilateral). Tobacco:  reports that he has never smoked. He does not have any smokeless tobacco history on file. Alcohol:  has no alcohol history on file. Drug:  has no drug  history on file.  Constitutional Exam  Vitals: Blood pressure 124/66, pulse 70, temperature 98 F (36.7 C), resp. rate 18, height 5\' 9"  (1.753 m), weight 254 lb (115.214 kg), SpO2 98 %. General appearance: Well nourished, well developed, and well hydrated. In no acute distress Calculated BMI/Body habitus: Body mass index is 37.49 kg/(m^2). (35-39.9 kg/m2) Severe obesity (Class  II) - 136% higher incidence of chronic pain Psych/Mental status: Alert and oriented x 3 (person, place, & time) Eyes: PERLA Respiratory: No evidence of acute respiratory distress  Cervical Spine Exam  Inspection: No masses, redness, or swelling Alignment: Symmetrical ROM: Functional: ROM is within functional limits [facility]) Stability: No instability detected Muscle strength & Tone: Functionally intact Sensory: Unimpaired Palpation: No complaints of tenderness  Upper Extremity (UE) Exam    Side: Right upper extremity  Side: Left upper extremity  Inspection: No masses, redness, swelling, or asymmetry  Inspection: No masses, redness, swelling, or asymmetry  ROM:  ROM:  Functional: Decreased ROM for the shoulders   Functional: Decreased ROM for the shoulder   Muscle strength & Tone: Functionally intact  Muscle strength & Tone: Functionally intact  Sensory: Unimpaired  Sensory: Unimpaired  Palpation: Tender  Palpation: Tender   Thoracic Spine Exam  Inspection: No masses, redness, or swelling Alignment: Symmetrical ROM: Functional: ROM is within functional limits [facility]) Stability: No instability detected Sensory: Unimpaired Muscle strength & Tone: Functionally intact Palpation: No complaints of tenderness  Lumbar Spine Exam  Inspection: No masses, redness, or swelling Alignment: Symmetrical ROM: Functional: Reduced ROM Stability: No instability detected Muscle strength & Tone: Functionally intact Sensory: Unimpaired Palpation: No complaints of tenderness Provocative Tests: Lumbar Hyperextension and rotation test: deferred Patrick's Maneuver: deferred  Gait & Posture Assessment  Ambulation: Patient ambulates using a wheel chair Gait: Very limited, using assistive device to ambulate Posture: Poor  Lower Extremity Exam    Side: Right lower extremity  Side: Left lower extremity  Inspection: No masses, redness, swelling, or asymmetry ROM:  Inspection: No masses, redness,  swelling, or asymmetry ROM:  Functional: ROM is within functional limits [facility])  Functional: ROM is within functional limits [facility])  Muscle strength & Tone: Functionally intact  Muscle strength & Tone: Functionally intact  Sensory: Unimpaired  Sensory: Unimpaired  Palpation: Non-contributory  Palpation: Non-contributory   Assessment & Plan  Primary Diagnosis & Pertinent Problem List: The primary encounter diagnosis was Chronic pain. Diagnoses of Encounter for therapeutic drug level monitoring, Long term current use of opiate analgesic, and Chronic shoulder pain (Location of Primary Source of Pain) (Bilateral) (R>L) were also pertinent to this visit.  Visit Diagnosis: 1. Chronic pain   2. Encounter for therapeutic drug level monitoring   3. Long term current use of opiate analgesic   4. Chronic shoulder pain (Location of Primary Source of Pain) (Bilateral) (R>L)     Problems updated and reviewed during this visit: Problem  Calcific Shoulder Tendinitis   Last Assessment & Plan:  Musculoskeletal and Arthritis pain is currently uncontrolled per Patient reported symptoms including Constant pain in both shoulders. Reflective of treatment with Opioids.  Of note, patient is taking oxycodone 15 mg TID and 5 mg as needed TID for breakthrough pain.  Diclofenac patch and lidoderm jelly denied by insurance; currently awaiting approval for bilateral cortisone shots.  Continue current regimen and follow up with providers regarding cortisone shots..   Stressed compliance to pain regimen and early treatment of symptoms to stay on top of pain.  Recommend nonpharmacologic therapy for pain management including  ice & heat.   Arteriosclerosis of Coronary Artery   Last Assessment & Plan:  Clinical ASCVD (s/p MI)currently appropriately treated with beta-blocker, statin, aspirin, ARB and nitroglycerin. Aspirin 81mg  daily is appropriate with absence of history of GI bleeding.  Given ASCVD and age >40,  high  intensity statin indicated.  Given medication compliance with current stable dose and absence of hypotension, Continue current regimen  Counseled regarding continuing physical activity, continuing a heart healthy diet & abstaining from tobacco  Counseled regarding the use of NTG for chest pain and to contact emergency services if chest pain did not resolve after first dose. Last Assessment & Plan:  Clinical ASCVD (s/p MI)currently appropriately treated with beta-blocker, statin, aspirin, ARB and nitroglycerin. Aspirin 81mg  daily is appropriate with absence of history of GI bleeding.  Given ASCVD and age >58,  high intensity statin indicated.  Given medication compliance with current stable dose and absence of hypotension, Continue current regimen  Counseled regarding continuing physical activity, continuing a heart healthy diet & abstaining from tobacco   Atrial Fibrillation (Hcc)   Last Assessment & Plan:  Currently rate controlled and anticoagulated appropriately. Reflective of treatment with metoprolol and warfarin 3 mg daily/2mg  on Wednesday in a setting where patient denies s/sx of bleeding, thromboembolism and stroke.  Last INR slightly subtherapeutic at 1.9.  Continue current therapy  Counseled on keeping a consistent diet while on warfarin therapy  Follow-up with Dr. Lamona Curl. Last Assessment & Plan:  Formatting of this note may be different from the original.  SUBJECTIVE:    taking warfarin 3mg  daily except om wed is 2mg .. Tolerating well, no abnormal bruising.  Also taking  Metop 100 xl daily. No palpitations, chest pain, sob  PLAN:  Continue warfarin 3mg  daily except 2mg  on wed Continue metop 100xl  Lab Results  Component Value Date   PT 31.8* 09/01/2015   PT 28.0* 08/31/2015   PT 14.4* 02/14/2012   INR 1.9 12/08/2015   INR 2.0 10/28/2015   INR 2.4 09/28/2015  Last Assessment & Plan:  Formatting of this note may be different from the original.  SUBJECTIVE:     taking warfarin 3mg  daily except om wed is 2mg .. Tolerating well, no abnormal bruising.  Also taking  Metop 100 xl daily. No palpitations, chest pain, sob  PLAN:  Continue warfarin 3mg  daily except 2mg  on wed Continue metop 100xl  Lab Results  Component Value Date   PT 14.4* 02/14/2012   PT 13.4* 02/13/2012   PT 12.6 02/12/2012   INR 2.5 03/23/2015   INR 3.1 03/16/2015   INR 3.3 03/10/2015    Benign Fibroma of Prostate   Last Assessment & Plan:  Currently controlled per patient report of no nighttime awakenings, heavy stream of urine, and being able to fully empty bladder. Reflective of treatment with tamsulosin.  Continue current therapy  Follow up with PCP. Last Assessment & Plan:  Currently controlled per patient report of no nighttime awakenings, heavy stream of urine, and being able to fully empty bladder. Reflective of treatment with tamsulosin.  Continue current therapy   Chronic Obstructive Pulmonary Disease (Hcc)   Last Assessment & Plan:  Currently controlled per the GOLD 2016 COPD guidelines based on daytime symptoms (None), nighttime symptoms (None). Reflective of treatment with albuterol BID and ipratropium BID. Of note, patient states he was told to use inhalers daily and not as needed.  Continue current regimen. Consider prescribing combination albuterol/ipratropium inhaler to reduce medication and cost burden.  Reviewed indication and  technique for each inhaler  Nonpharmalogic measures recommended, including: Pneumococcal and Influenza vaccine Last Assessment & Plan:  Formatting of this note may be different from the original.  SUBJECTIVE:    Followed by [facility] pulm,  Placed on inhalers, feel that sob is combination of copd and hf.  Was given spiriva, albuterol.  Oxygen dependent. Doing well and  stable  PLAN:  COPD sx stable on above inhalers.  Pt would like to continue atrovent and not use spirivia due to pt having glaucoma. Exercise may help improve  symptoms.  Recommend Flu shot annually. Pneumonia shot once.  Pulmonology following closely.    SpO2 Readings from Last 3 Encounters:  09/27/15 99%  09/02/15 97%  05/24/15 99%  Last Assessment & Plan:  Formatting of this note may be different from the original.  SUBJECTIVE:    Followed by [facility] pulm,  Placed on inhalers, feel that sob is combination of copd and hf.  Was given spiriva, albuterol.  Oxygen dependent. Doing well and  stable  PLAN:  COPD sx stable on above inhalers. No recent hospitalization. Pt would like to continue atrovent and not use spirivia due to pt having glaucoma. Exercise may help improve symptoms.  Recommend Flu shot annually. Pneumonia shot once.  Pulmonology following closely.    SpO2 Readings from Last 3 Encounters:  02/15/15 92%  12/16/14 96%  11/25/14 99%    Type 2 Diabetes Mellitus (Hcc)   Last Assessment & Plan:  Currently controlled with A1c 7.5% in relation to goal A1c <8% per ADA guidelines.  Reflective of treatment with glargine 30-40 units depending on BG and regular insulin 15-20 units depending on BG. Of note, patient will not take any insulin on days when BG is 100-110.  Given medication regimen complexity and absence of hypoglycemia, recommend patient take glargine 30 units BID as prescribed and continue regular insulin as sliding scale to maintain more consistent BG.  Taking glargine consistently daily may reduce need for meal time insulin. Of note, patient and wife have refused changing regimen in the past.  Reviewed signs and symptoms of hypoglycemia and appropriate treatment when blood glucose is <70.  Recommend routine blood glucose monitoring 2-3 times a day and any time hypoglycemia is concern.  Counseled regarding lifestyle modifications including 150 minutes of exercise per week and low carb diet. Patient agrees to continue low carb diet and exercise.  Comprehensive care for DM assessed and includes high intensity statin, aspirin  81mg , ACE/ARB (losartan), annual foot exam, annual eye exam, vaccines (influenza and pneumococcal) and A1c.    Due for annual microalbuminuria, fasting lipid panel and vitamin B12 Level.  Recommend following up with Dr. Reece Levy Last Assessment & Plan:  Formatting of this note may be different from the original.  SUBJECTIVE:    Takes lantus and reg insulin.  No polyuria or polydipsia.  Compliant with meds. Am sugars in 120s to 140s  PLAN:  DIABETES PLAN   Dm goal is <8.0% Lab Results  Component Value Date   A1C 7.5* 10/28/2015   A1C 7.7* 08/04/2015   A1C 7.6* 04/06/2015   BP goal is <140/90 BP Readings from Last 3 Encounters:  12/08/15 116/60  12/07/15 102/66  11/09/15 125/65   Current Diabetes Medications:   If his sugar is >250 she gives 40 Lantus, 20 regular. If less than 250, gives 30 Lantus and 15 of regular.  This is twice/day.   Today's Recommendations: discussed that this a irregular regimen and would be better to have  consistent dosing, but patient and wife declines and want to stick to current regimen.  Recommend continued regular exercise as tolerated. Recommend avoiding carbohydrate rich foods and moderation.  Diabetes Quality Metrics  Metric Comments  Most Recent Foot Exam Date: 06/16/2015   Most Recent Eye Exam Date: 01/01/2015   Prescribed aspirin: Yes   Prescribed ACE inhibitors: No OR Prescribed ARBs: Yes   Prescribed statins: Yes   Pneumonia vaccination: 02/23/2014   Smokes tobacco: No    Most Recent Diabetes Labs Lab Results  Component Value Date   LDL 66 04/05/2014   MALBCRERAT 13.0 05/25/2014   NA 143 09/02/2015   K 3.9 09/02/2015   CREATININE 1.21 09/02/2015   Foot ulceration healing well, no signs of infection. Last Assessment & Plan:  Formatting of this note may be different from the original.  SUBJECTIVE:    Takes lantus and reg insulin.  No polyuria or polydipsia.  Compliant with meds. Am sugars in 120s to 140s  PLAN:  DIABETES  PLAN   Dm goal is <8.0% Lab Results  Component Value Date   A1C 7.6* 04/06/2015   A1C 7.5* 12/16/2014   A1C 7.8* 09/23/2014   BP goal is <140/90 BP Readings from Last 3 Encounters:  04/06/15 112/60  02/15/15 110/70  02/04/15 120/70   Current Diabetes Medications: lantus 40 units three times a day  Today's Recommendations: Continue current regimen.  Recommend continued regular exercise as tolerated. Recommend avoiding carbohydrate rich foods and moderation.  Diabetes Quality Metrics  Metric Comments  Most Recent Foot Exam Date: 05/25/2014   Most Recent Eye Exam Date: 01/01/2015   Prescribed aspirin: Yes   Prescribed ACE inhibitors: No OR Prescribed ARBs: Yes   Prescribed statins: Yes   Pneumonia vaccination: 02/23/2014   Smokes tobacco: No    Most Recent Diabetes Labs Lab Results  Component Value Date   LDL 66 04/05/2014   MALBCRERAT 13.0 05/25/2014   NA 145 04/06/2015   K 3.8 04/06/2015   CREATININE 1.40* 04/06/2015    Adult Hypothyroidism   Last Assessment & Plan:  Currently untreated with TSH = <0.13 (range:0.6-3.30) which would indicate hyperthyroidism. Free T4 WNL. Reports presence of symptoms including: constipation. Reflective of no treatment.  Recommend recheck of TSH and free T4 and begin appropriate treatment if labs indicate thyroid dysfunction.  Follow up with Dr. Reece Levy. Last Assessment & Plan:   SUBJECTIVE:    Not on meds, but had tsh from last year that was low.  PLAN:  Recheck tsh and free t 4 today. Last Assessment & Plan:  Formatting of this note may be different from the original. SUBJECTIVE:     last tsh abnormal. \unclear if he is on meds,.  PLAN:  Will recheck tsh and free t4 and follow up.  Lab Results  Component Value Date   TSH <0.01* 11/20/2014    Benign Hypertension   Last Assessment & Plan:  Currently controlled per MD reported blood pressure of 116/60, below goal blood pressure of <140/90 mmHg per [facility] guidelines.  Reflective of treatment with losartan, furosemide, and metoprolol.   Continue current regimen  Discussed home monitoring of blood pressure with frequency of few times per month  Counseled on monitoring for side of hypertension/hypotension including light-headedness, dizziness, syncope, fatigue or headache Last Assessment & Plan:  Currently controlled per MD reported blood pressure of 113/64, below goal blood pressure of <140/90 mmHg per [facility] guidelines. Reflective of treatment with losartan, furosemide, and metoprolol.   Continue current regimen  Discussed home monitoring of blood pressure with frequency of few times per month  Counseled on monitoring for side of hypertension/hypotension including light-headedness, dizziness, syncope, fatigue or headache Last Assessment & Plan:  Formatting of this note may be different from the original.  SUBJECTIVE:    No cp, sob, ha, le edema. Compliant with medications.  PLAN:  HTN PLAN   BP goal is <140/90 BP Readings from Last 3 Encounters:  04/06/15 112/60  02/15/15 110/70  02/04/15 120/70   Current Anti-Hyptensive Medications: metop 100 xl, losartan 50mg , lasix 120mg  bid  Today's Recommendations: Continue current meds  Recommend continued regular exercise as tolerated. Recommend avoiding carbohydrate rich foods and moderation. Recommend  low salt diet.  Most Recent Hypertension Labs Lab Results  Component Value Date   NA 145 04/06/2015   NA 138 10/21/2014   K 3.8 04/06/2015   K 4.7 10/21/2014   BUN 27* 04/06/2015   BUN 44* 10/21/2014   CREATININE 1.40* 04/06/2015   CREATININE 1.48* 10/21/2014   CREATININE 1.4 03/09/2014      Problem-specific Plan(s): No problem-specific assessment & plan notes found for this encounter.  No new assessment & plan notes have been filed under this hospital service since the last note was generated. Service: Pain Management   Plan of Care   Problem List Items Addressed This Visit       High   Chronic pain - Primary (Chronic)   Relevant Medications   PARoxetine (PAXIL) 20 MG tablet   oxyCODONE (OXY IR/ROXICODONE) 5 MG immediate release tablet   oxyCODONE (OXY IR/ROXICODONE) 5 MG immediate release tablet   oxyCODONE (OXY IR/ROXICODONE) 5 MG immediate release tablet   oxyCODONE (ROXICODONE) 15 MG immediate release tablet   oxyCODONE (ROXICODONE) 15 MG immediate release tablet   oxyCODONE (ROXICODONE) 15 MG immediate release tablet   Chronic shoulder pain (Location of Primary Source of Pain) (Bilateral) (R>L) (Chronic)   Relevant Orders   SHOULDER INJECTION   SUPRASCAPULAR NERVE BLOCK     Medium   Encounter for therapeutic drug level monitoring   Long term current use of opiate analgesic (Chronic)   Relevant Orders   Drug Screen 10 W/Conf, Serum       Pharmacotherapy (Medications Ordered): Meds ordered this encounter  Medications  . oxyCODONE (OXY IR/ROXICODONE) 5 MG immediate release tablet    Sig: Take 1 tablet (5 mg total) by mouth every 8 (eight) hours as needed for severe pain.    Dispense:  90 tablet    Refill:  0    Do not place this medication, or any other prescription from our practice, on "Automatic Refill". Patient may have prescription filled one day early if pharmacy is closed on scheduled refill date. Do not fill until: 03/15/16 To last until: 04/14/16  . oxyCODONE (OXY IR/ROXICODONE) 5 MG immediate release tablet    Sig: Take 1 tablet (5 mg total) by mouth every 8 (eight) hours as needed for severe pain.    Dispense:  90 tablet    Refill:  0    Do not place this medication, or any other prescription from our practice, on "Automatic Refill". Patient may have prescription filled one day early if pharmacy is closed on scheduled refill date. Do not fill until: 04/14/16 To last until: 05/14/16  . oxyCODONE (OXY IR/ROXICODONE) 5 MG immediate release tablet    Sig: Take 1 tablet (5 mg total) by mouth every 8 (eight) hours as needed for severe pain.     Dispense:  90  tablet    Refill:  0    Do not place this medication, or any other prescription from our practice, on "Automatic Refill". Patient may have prescription filled one day early if pharmacy is closed on scheduled refill date. Do not fill until: 05/14/16 To last until: 06/12/16  . oxyCODONE (ROXICODONE) 15 MG immediate release tablet    Sig: Take 1 tablet (15 mg total) by mouth every 8 (eight) hours as needed for pain.    Dispense:  90 tablet    Refill:  0    Do not place this medication, or any other prescription from our practice, on "Automatic Refill". Patient may have prescription filled one day early if pharmacy is closed on scheduled refill date. Do not fill until: 03/15/16 To last until: 04/14/16  . oxyCODONE (ROXICODONE) 15 MG immediate release tablet    Sig: Take 1 tablet (15 mg total) by mouth every 8 (eight) hours as needed for pain.    Dispense:  90 tablet    Refill:  0    Do not place this medication, or any other prescription from our practice, on "Automatic Refill". Patient may have prescription filled one day early if pharmacy is closed on scheduled refill date. Do not fill until: 04/14/16 To last until: 05/14/16  . oxyCODONE (ROXICODONE) 15 MG immediate release tablet    Sig: Take 1 tablet (15 mg total) by mouth every 8 (eight) hours as needed for pain.    Dispense:  90 tablet    Refill:  0    Do not place this medication, or any other prescription from our practice, on "Automatic Refill". Patient may have prescription filled one day early if pharmacy is closed on scheduled refill date. Do not fill until: 05/14/16 To last until: 06/12/16    [facility] & Procedure Ordered: Orders Placed This Encounter  Procedures  . SHOULDER INJECTION  . SUPRASCAPULAR NERVE BLOCK  . Drug Screen 10 W/Conf, Serum    Imaging Ordered: None  Interventional Therapies: Scheduled:  None at this point.    Considering:   1. Bilateral intra-articular shoulder injection under  fluoroscopic guidance, no sedation. The patient was instructed to stop the Coumadin 5 days prior to procedure.  2. Diagnostic bilateral suprascapular nerve block under fluoroscopic guidance, with or without sedation.  3. Possible bilateral suprascapular nerve radiofrequency ablation under fluoroscopic guidance and IV sedation.    PRN Procedures:   1. Palliative Bilateral intra-articular shoulder injection under fluoroscopic guidance, no sedation. The patient was instructed to stop the Coumadin 5 days prior to procedure.  2. Diagnostic bilateral suprascapular nerve block under fluoroscopic guidance, with or without sedation.    Referral(s) or Consult(s): None at this time.  New Prescriptions   No medications on file    Medications administered during this visit: Mr. Hayman had no medications administered during this visit.  Requested PM Follow-up: Return in about 3 months (around 05/22/2016) for Procedure (PRN - Patient will call), Medication Management, (3-Mo).  Future Appointments Date Time Provider [place]  05/22/2016 1:20 PM Milinda Pointer, MD [facility] None    Primary Care Physician: Shella Spearing, MD Location: [facility] Facility Note by: Kathlen Brunswick Dossie Arbour, M.D, DABA, DABAPM, DABPM, DABIPP, FIPP  Pain Score Disclaimer: We use the NRS-11 scale. This is a self-reported, subjective measurement of pain severity with only modest accuracy. It is used primarily to identify changes within a particular patient. It must be understood that outpatient pain scales are significantly less accurate that those  used for research, where they can be applied under ideal controlled circumstances with minimal exposure to variables. In reality, the score is likely to be a combination of pain intensity and pain affect, where pain affect describes the degree of emotional arousal or changes in action readiness caused by the sensory experience of pain. Factors such as social  and work situation, setting, emotional state, anxiety levels, expectation, and prior pain experience may influence pain perception and show large inter-individual differences that may also be affected by time variables.  Patient instructions provided during this appointment: Patient Instructions  Patient instructed to go to lab today for serum drug screen.  Prescriptions given for roxicodone x3 and oxycodone IR x3.  Call for procedure when needed. See below for instructions. Trigger Point Injections Patient Information  Description: Trigger points are areas of muscle sensitive to touch which cause pain with movement, sometimes felt some distance from the site of palpation.  Usually the muscle containing these trigger points if felt as a tight band or knot.   The area of maximum tenderness or trigger point is identified, and after antiseptic preparation of the skin, a small needle is placed into this site.  Reproduction of the pain often occurs and numbing medicine (local anesthetic) is injected into the site, sometimes along with steroid preparation.  The entire block usually lasts less than 5 minutes.  Conditions which may be treated by trigger points:   Muscular pain and spasm  Nerve irritation  Preparation for the injection:  1. Do not eat any solid food or dairy products within 8 hours of your appointment. 2. You may drink clear liquids up to 3 hours before appointment.  Clear liquids include water, black coffee, juice or soda.  No milk or cream please. 3. You may take your regular medications, including pain medications, with a sip of water before your appointment.  Diabetics should hold regular insulin ( if take separately) and take 1/2 normal NPH dose the morning of the procedure.  Carry some sugar containing items with you to your appointment. 4. A driver must accompany you and be prepared to drive you home after your procedure.  5. Bring all your current medications with you. 6. An  IV may be inserted and sedation may be given at the discretion of the physician.  7. A blood pressure cuff, EKG, and other monitors will often be applied during the procedure.  Some patients may need to have extra oxygen administered for a short period. 8. You will be asked to provide medical information, including your allergies and medications, prior to the procedure.  We must know immediately if you are taking blood thinners (like Coumadin/Warfarin) or if you are allergic to IV iodine contrast (dye).  We must know if you could possibly be pregnant.  Possible side-effects:   Bleeding from needle site  Infection (rare, may require surgery)  Nerve injury (rare)  Numbness & tingling (temporary)  Punctured lung (if injection around chest)  Light-headedness (temporary)  Pain at injection site (several days)  Decreased blood pressure (rare, temporary)  Weakness in arm/leg (temporary)  Call if you experience:   Hive or difficulty breathing (go to the emergency room)  Inflammation or drainage at the injection site(s)  Please note:  Although the local anesthetic injected can often make your painful muscle feel good for several hours after the injection, the pain may return.  It takes 3-7 days for steroids to work.  You may not notice any pain relief for at  least one week.  If effective, we will often do a series of injections spaced 3-6 weeks apart to maximally decrease your pain.  If you have any questions please call [phone] [facility]

## 2016-03-07 LAB — DRUG SCREEN 10 W/CONF, SERUM
Amphetamines, IA: NEGATIVE ng/mL
BENZODIAZEPINES, IA: NEGATIVE ng/mL
Barbiturates, IA: NEGATIVE ug/mL
COCAINE & METABOLITE, IA: NEGATIVE ng/mL
Methadone, IA: NEGATIVE ng/mL
OPIATES, IA: NEGATIVE ng/mL
Oxycodones, IA: POSITIVE ng/mL
PHENCYCLIDINE, IA: NEGATIVE ng/mL
Propoxyphene, IA: NEGATIVE ng/mL
THC(MARIJUANA) METABOLITE, IA: NEGATIVE ng/mL

## 2016-03-07 LAB — OXYCODONES,MS,WB/SP RFX
OXYCOCONE: 43.3 ng/mL
Oxycodones Confirmation: POSITIVE
Oxymorphone: NEGATIVE ng/mL

## 2016-03-15 ENCOUNTER — Encounter: Payer: Medicare Other | Admitting: Pain Medicine

## 2016-05-22 ENCOUNTER — Encounter: Payer: Self-pay | Admitting: Pain Medicine

## 2016-05-22 ENCOUNTER — Ambulatory Visit: Payer: Medicare Other | Attending: Pain Medicine | Admitting: Pain Medicine

## 2016-05-22 VITALS — BP 103/51 | HR 57 | Temp 97.9°F | Resp 16 | Ht 69.0 in | Wt 250.0 lb

## 2016-05-22 DIAGNOSIS — M545 Low back pain: Secondary | ICD-10-CM | POA: Diagnosis not present

## 2016-05-22 DIAGNOSIS — G8929 Other chronic pain: Secondary | ICD-10-CM

## 2016-05-22 DIAGNOSIS — J449 Chronic obstructive pulmonary disease, unspecified: Secondary | ICD-10-CM | POA: Insufficient documentation

## 2016-05-22 DIAGNOSIS — I5022 Chronic systolic (congestive) heart failure: Secondary | ICD-10-CM | POA: Insufficient documentation

## 2016-05-22 DIAGNOSIS — L570 Actinic keratosis: Secondary | ICD-10-CM | POA: Insufficient documentation

## 2016-05-22 DIAGNOSIS — I2510 Atherosclerotic heart disease of native coronary artery without angina pectoris: Secondary | ICD-10-CM | POA: Diagnosis not present

## 2016-05-22 DIAGNOSIS — Z87891 Personal history of nicotine dependence: Secondary | ICD-10-CM | POA: Insufficient documentation

## 2016-05-22 DIAGNOSIS — M25561 Pain in right knee: Secondary | ICD-10-CM | POA: Diagnosis not present

## 2016-05-22 DIAGNOSIS — M7530 Calcific tendinitis of unspecified shoulder: Secondary | ICD-10-CM | POA: Insufficient documentation

## 2016-05-22 DIAGNOSIS — E113319 Type 2 diabetes mellitus with moderate nonproliferative diabetic retinopathy with macular edema, unspecified eye: Secondary | ICD-10-CM | POA: Diagnosis not present

## 2016-05-22 DIAGNOSIS — D291 Benign neoplasm of prostate: Secondary | ICD-10-CM | POA: Diagnosis not present

## 2016-05-22 DIAGNOSIS — R37 Sexual dysfunction, unspecified: Secondary | ICD-10-CM | POA: Insufficient documentation

## 2016-05-22 DIAGNOSIS — H548 Legal blindness, as defined in USA: Secondary | ICD-10-CM | POA: Diagnosis not present

## 2016-05-22 DIAGNOSIS — N400 Enlarged prostate without lower urinary tract symptoms: Secondary | ICD-10-CM | POA: Insufficient documentation

## 2016-05-22 DIAGNOSIS — Z85828 Personal history of other malignant neoplasm of skin: Secondary | ICD-10-CM | POA: Insufficient documentation

## 2016-05-22 DIAGNOSIS — G4733 Obstructive sleep apnea (adult) (pediatric): Secondary | ICD-10-CM | POA: Diagnosis not present

## 2016-05-22 DIAGNOSIS — I130 Hypertensive heart and chronic kidney disease with heart failure and stage 1 through stage 4 chronic kidney disease, or unspecified chronic kidney disease: Secondary | ICD-10-CM | POA: Insufficient documentation

## 2016-05-22 DIAGNOSIS — X58XXXA Exposure to other specified factors, initial encounter: Secondary | ICD-10-CM | POA: Insufficient documentation

## 2016-05-22 DIAGNOSIS — Z79891 Long term (current) use of opiate analgesic: Secondary | ICD-10-CM

## 2016-05-22 DIAGNOSIS — M25511 Pain in right shoulder: Secondary | ICD-10-CM

## 2016-05-22 DIAGNOSIS — Z7901 Long term (current) use of anticoagulants: Secondary | ICD-10-CM | POA: Diagnosis not present

## 2016-05-22 DIAGNOSIS — Z955 Presence of coronary angioplasty implant and graft: Secondary | ICD-10-CM | POA: Insufficient documentation

## 2016-05-22 DIAGNOSIS — M25562 Pain in left knee: Secondary | ICD-10-CM | POA: Insufficient documentation

## 2016-05-22 DIAGNOSIS — E1122 Type 2 diabetes mellitus with diabetic chronic kidney disease: Secondary | ICD-10-CM | POA: Insufficient documentation

## 2016-05-22 DIAGNOSIS — H409 Unspecified glaucoma: Secondary | ICD-10-CM | POA: Diagnosis not present

## 2016-05-22 DIAGNOSIS — J439 Emphysema, unspecified: Secondary | ICD-10-CM | POA: Insufficient documentation

## 2016-05-22 DIAGNOSIS — Z7982 Long term (current) use of aspirin: Secondary | ICD-10-CM | POA: Insufficient documentation

## 2016-05-22 DIAGNOSIS — I4891 Unspecified atrial fibrillation: Secondary | ICD-10-CM | POA: Insufficient documentation

## 2016-05-22 DIAGNOSIS — F1190 Opioid use, unspecified, uncomplicated: Secondary | ICD-10-CM

## 2016-05-22 DIAGNOSIS — S91309A Unspecified open wound, unspecified foot, initial encounter: Secondary | ICD-10-CM | POA: Insufficient documentation

## 2016-05-22 DIAGNOSIS — M546 Pain in thoracic spine: Secondary | ICD-10-CM | POA: Diagnosis not present

## 2016-05-22 DIAGNOSIS — K219 Gastro-esophageal reflux disease without esophagitis: Secondary | ICD-10-CM | POA: Insufficient documentation

## 2016-05-22 DIAGNOSIS — Z86718 Personal history of other venous thrombosis and embolism: Secondary | ICD-10-CM | POA: Insufficient documentation

## 2016-05-22 DIAGNOSIS — F329 Major depressive disorder, single episode, unspecified: Secondary | ICD-10-CM | POA: Insufficient documentation

## 2016-05-22 DIAGNOSIS — F411 Generalized anxiety disorder: Secondary | ICD-10-CM | POA: Insufficient documentation

## 2016-05-22 DIAGNOSIS — Z6836 Body mass index (BMI) 36.0-36.9, adult: Secondary | ICD-10-CM | POA: Insufficient documentation

## 2016-05-22 DIAGNOSIS — M25512 Pain in left shoulder: Secondary | ICD-10-CM

## 2016-05-22 DIAGNOSIS — E7800 Pure hypercholesterolemia, unspecified: Secondary | ICD-10-CM | POA: Insufficient documentation

## 2016-05-22 DIAGNOSIS — Z794 Long term (current) use of insulin: Secondary | ICD-10-CM | POA: Insufficient documentation

## 2016-05-22 DIAGNOSIS — N189 Chronic kidney disease, unspecified: Secondary | ICD-10-CM | POA: Insufficient documentation

## 2016-05-22 DIAGNOSIS — I252 Old myocardial infarction: Secondary | ICD-10-CM | POA: Insufficient documentation

## 2016-05-22 DIAGNOSIS — M810 Age-related osteoporosis without current pathological fracture: Secondary | ICD-10-CM | POA: Insufficient documentation

## 2016-05-22 MED ORDER — OXYCODONE HCL 15 MG PO TABS: 15.0000 mg | ORAL_TABLET | Freq: Three times a day (TID) | ORAL | 0 refills | Status: DC | PRN

## 2016-05-22 MED ORDER — OXYCODONE HCL 5 MG PO TABS: 5.0000 mg | ORAL_TABLET | Freq: Three times a day (TID) | ORAL | 0 refills | Status: DC | PRN

## 2016-05-22 NOTE — Progress Notes (Signed)
Safety precautions to be maintained throughout the outpatient stay will include: orient to surroundings, keep bed in low position, maintain call bell within reach at all times, provide assistance with transfer out of bed and ambulation. Pill count is  Oxycodone 5 mg is 80/90 filled on 05/18/2016. Oxycodone 15 mg  Tablets are 80/90 filled on 05/18/2016

## 2016-05-22 NOTE — Progress Notes (Signed)
Patient's Name: Bryan Merritt  MRN: [id number]  Referring Provider: Shella Spearing, MD  DOB: Jul 22, 1936  PCP: Shella Spearing, MD  DOS: 05/22/2016  Note by: Kathlen Brunswick. Dossie Arbour, MD  Service setting: Ambulatory outpatient  Specialty: Interventional Pain Management  Location: [facility] (AMB) Pain Management Facility    Patient type: Established   Primary Reason(s) for Visit: Encounter for prescription drug management (Level of risk: moderate) CC: Shoulder Pain (both) and Knee Pain (both)  HPI  Bryan Merritt is a 80 y.o. year old, male patient, who returns today as an established patient. He has Encounter for therapeutic drug level monitoring; Long term current use of opiate analgesic; Opiate use (90 MME/Day); Chronic pain syndrome; Actinic keratoses; Atrial fibrillation ([place]); Borderline glaucoma; Benign fibroma of prostate; Arteriosclerosis of coronary artery; Calcific shoulder tendinitis; Type 2 diabetes mellitus with diabetic chronic kidney disease ([place]); Chronic obstructive pulmonary disease ([place]); Diabetic macular edema ([place]); Gastro-esophageal reflux disease without esophagitis; Type 2 diabetes mellitus ([place]); H/O neoplasm; Hypercholesterolemia; Benign hypertension; Adult hypothyroidism; Other reduced mobility; Lichenification and lichen simplex chronicus; Recurrent major depression in remission ([place]); Moderate nonproliferative diabetic retinopathy ([place]); Obstructive apnea; H/O malignant neoplasm of skin; Other sexual dysfunction not due to a substance or known physiological condition; Systolic heart failure ([place]); Proliferative diabetic retinopathy ([place]); Chronic shoulder pain (Location of Primary Source of Pain) (Bilateral) (R>L); Chronic low back pain; Chronic upper back pain; Chronic knee pain (Bilateral); Legally blind; History of knee surgery; Obesity, Class II, BMI 35-39.9; Diabetic peripheral neuropathy ([place]); Hx of long term use of blood thinners; Generalized anxiety disorder; Depression; History of MI  (myocardial infarction); History of DVT (deep vein thrombosis); Ex-smoker; Emphysema lung ([place]); Chronic pain; Long term prescription opiate use; Encounter for chronic pain management; Breathlessness on exertion; Neurogenic pain; Neuropathic pain; Senile osteoporosis; Prostatic hypertrophy; Urinary anomaly; At high risk for falls; At risk for falling; and Open wound of heel on his problem list.. His primarily concern today is the Shoulder Pain (both) and Knee Pain (both)  Pain Assessment: Self-Reported Pain Score: 4              Reported level is compatible with observation.       Pain Type: Chronic pain Pain Location: Shoulder Pain Orientation: Right, Left Pain Descriptors / Indicators: Aching, Constant, Sharp, Radiating Pain Frequency: Constant  The patient comes into the clinics today for pharmacological management of his chronic pain. I last saw this patient on 02/28/2016. The patient  has no drug history on file. His body mass index is 36.92 kg/m.  Date of Last Visit: 02/28/16 Service Provided on Last Visit: Med Refill  Controlled Substance Pharmacotherapy Assessment & REMS (Risk Evaluation and Mitigation Strategy)  Analgesic: Oxycodone IR 15 mg every 8 hours (45 mg/day) + oxycodone IR 5 mg every 8 hours (15 mg/day) (total 60 mg/day) MME/day: 90 mg/day. Pill Count: Oxycodone 5 mg is 80/90 filled on 05/18/2016. Oxycodone 15 mg  Tablets are 80/90 filled on 05/18/2016. Pharmacokinetics: Onset of action (Liberation/Absorption): Within expected pharmacological parameters Time to Peak effect (Distribution): Timing and results are as within normal expected parameters Duration of action (Metabolism/Excretion): Within normal limits for medication Pharmacodynamics: Analgesic Effect: More than 50% Activity Facilitation: Medication(s) allow patient to sit, stand, walk, and do the basic ADLs Perceived Effectiveness: Described as relatively effective, allowing for increase in activities of daily  living (ADL) Side-effects or Adverse reactions: None reported Monitoring: [place] PMP: Online review of the past 54-month period conducted. Compliant with practice rules and regulations Last  Serum Drug Screen:  Lab Results  Component Value Date   AMPHSCRSER Negative 02/28/2016   BARBSCRSER Negative 02/28/2016   BENZOSCRSER Negative 02/28/2016   COCAINSCRSER Negative 02/28/2016   PCPSCRSER Negative 02/28/2016   THCSCRSER Negative 02/28/2016   OPIATESCRSER Negative 02/28/2016   OXYSCRSER ++POSITIVE++ 02/28/2016   METHDSCRSER Negative 02/28/2016   PROPOXSCRSER Negative 02/28/2016   UDS interpretation: Compliant          Medication Assessment Form: Reviewed. Patient indicates being compliant with therapy Treatment compliance: Compliant Risk Assessment: Aberrant Behavior: None observed today Substance Use Disorder (SUD) Risk Level: No change since last visit Risk of opioid abuse or dependence: 0.7-3.0% with doses ? 36 MME/day and 6.1-26% with doses ? 120 MME/day. Opioid Risk Tool (ORT) Score: Total Score: 3 Low Risk for SUD (Score <3) Depression Scale Score: PHQ-2: PHQ-2 Total Score: 0 No depression (0) PHQ-9: PHQ-9 Total Score: 0 No depression (0-4)  Pharmacologic Plan: No change in therapy, at this time  Laboratory Chemistry  Inflammation Markers Lab Results  Component Value Date   ESRSEDRATE 37 (H) 09/20/2015   CRP 0.6 09/20/2015   Renal Function Lab Results  Component Value Date   BUN 32 (H) 09/20/2015   CREATININE 1.48 (H) 09/20/2015   GFRAA 51 (L) 09/20/2015   GFRNONAA 44 (L) 09/20/2015   Hepatic Function Lab Results  Component Value Date   AST 16 09/20/2015   ALT 17 09/20/2015   ALBUMIN 3.9 09/20/2015   Electrolytes Lab Results  Component Value Date   NA 136 09/20/2015   K 3.8 09/20/2015   CL 95 (L) 09/20/2015   CALCIUM 8.6 (L) 09/20/2015   MG 2.1 09/20/2015   Pain Modulating Vitamins Lab Results  Component Value Date   VD125OH2TOT 31.2 09/20/2015    VITAMINB12 368 09/20/2015   Coagulation Parameters Lab Results  Component Value Date   INR 1.7 07/31/2012   LABPROT 20.2 (H) 07/31/2012   APTT 30.9 02/06/2012   PLT 194 02/07/2012   Cardiovascular Lab Results  Component Value Date   HGB 12.3 (L) 02/07/2012   HCT 35.2 (L) 02/07/2012   Note: Lab results reviewed.  Recent Diagnostic Imaging  No results found.  Meds  The patient has a current medication list which includes the following prescription(s): albuterol, alprazolam, aspirin, atorvastatin, b-d ins syr ultrafine 1cc/31g, bayer contour test, glucocom blood glucose monitor, ra blood glucose monitor, combigan, furosemide, insulin glargine, insulin regular, insulin syringe-needle u-100, insulin syringe-needle u-100, ipratropium, accu-chek multiclix, losartan, magnesium oxide, menthol (topical analgesic), metoprolol succinate, naloxone hcl, nitroglycerin, omeprazole, oxycodone, oxycodone, oxycodone, oxycodone, oxycodone, oxycodone, paroxetine, polyethylene glycol, tamsulosin, terconazole, triamcinolone ointment, and warfarin.  Current Outpatient Prescriptions on File Prior to Visit  Medication Sig  . albuterol (PROVENTIL HFA;VENTOLIN HFA) 108 (90 Base) MCG/ACT inhaler Inhale 2 puffs into the lungs 3 (three) times daily.  Marland Kitchen ALPRAZolam (XANAX) 0.5 MG tablet Take 0.5 mg by mouth at bedtime as needed for anxiety.  Marland Kitchen aspirin 81 MG tablet Take 81 mg by mouth daily.  Marland Kitchen atorvastatin (LIPITOR) 40 MG tablet Take 40 mg by mouth daily.  . B-D INS SYR ULTRAFINE 1CC/31G 31G X 5/16" 1 ML MISC See admin instructions. with insulin  . BAYER CONTOUR TEST test strip USE WITH METER TO TEST BLOOD SUGAR 6 TIMES DAILY. DX:E11.21  . Blood Glucose Monitoring Suppl (RA BLOOD GLUCOSE MONITOR) DEVI   . furosemide (LASIX) 40 MG tablet Take 120 mg by mouth 2 (two) times daily.   . insulin glargine (LANTUS) 100 UNIT/ML injection Inject  40 Units into the skin 3 (three) times daily.  . insulin regular (NOVOLIN  R,HUMULIN R) 100 units/mL injection Inject 20 Units into the skin 3 (three) times daily before meals.  . Insulin Syringe-Needle U-100 (B-D INS SYR ULTRAFINE 1CC/31G) 31G X 5/16" 1 ML MISC   . ipratropium (ATROVENT HFA) 17 MCG/ACT inhaler Inhale 2 puffs into the lungs 2 (two) times daily.  Marland Kitchen losartan (COZAAR) 25 MG tablet TAKE 1 TABLET (25 MG TOTAL) BY MOUTH DAILY.  . magnesium oxide (MAG-OX) 400 MG tablet Take 400 mg by mouth daily.  . metoprolol succinate (TOPROL-XL) 100 MG 24 hr tablet Take 100 mg by mouth daily. Take with or immediately following a meal.  . Naloxone HCl (NARCAN) 4 MG/0.1ML LIQD Place 1 Bottle into the nose once. , then call 911, repeat if needed in other nostril with new bottle.  . nitroGLYCERIN (NITROSTAT) 0.4 MG SL tablet Place 0.4 mg under the tongue every 5 (five) minutes as needed for chest pain.  Marland Kitchen omeprazole (PRILOSEC) 20 MG capsule Take 20 mg by mouth 2 (two) times daily before a meal.  . PARoxetine (PAXIL) 20 MG tablet TAKE 1 TABLET (20 MG TOTAL) BY MOUTH EVERY MORNING.  Marland Kitchen polyethylene glycol (MIRALAX / GLYCOLAX) packet Take 17 g by mouth as needed.   . tamsulosin (FLOMAX) 0.4 MG CAPS capsule Take 0.8 mg by mouth daily. Reported on 02/28/2016  . triamcinolone ointment (KENALOG) 0.1 % Apply topically as needed.   . warfarin (COUMADIN) 3 MG tablet Take 3 mg by mouth daily.    No current facility-administered medications on file prior to visit.     ROS  Constitutional: Denies any fever or chills Gastrointestinal: No reported hemesis, hematochezia, vomiting, or acute GI distress Musculoskeletal: Denies any acute onset joint swelling, redness, loss of ROM, or weakness Neurological: No reported episodes of acute onset apraxia, aphasia, dysarthria, agnosia, amnesia, paralysis, loss of coordination, or loss of consciousness  Allergies  Bryan Merritt is allergic to lisinopril.  [place]  Medical:  Bryan Merritt  has a past medical history of Anxiety; Asthma; CAD (coronary artery  disease); Cancer ([place]) (2016); Cardiac arrhythmia; Depression; Diabetes mellitus without complication ([place]); Emphysema lung ([place]) (06/21/2015); Emphysema of lung ([place]); Ex-smoker (06/21/2015); Gallbladder disease; History of DVT (deep vein thrombosis) (06/21/2015); History of MI (myocardial infarction) (06/21/2015); Hypercholesteremia; MI (myocardial infarction) ([place]); Muscle disorder; Pancreatitis; and Spine disorder. Family: family history includes Alcohol abuse in his father; Cancer in his mother; Diabetes in his mother; Heart disease in his father. Surgical:  has a past surgical history that includes cardiac stents; Cholecystectomy; Appendectomy; Back surgery; and Knee surgery (Bilateral). Tobacco:  reports that he has never smoked. He does not have any smokeless tobacco history on file. Alcohol:  has no alcohol history on file. Drug:  has no drug history on file.  Constitutional Exam  General appearance: Well nourished, well developed, and well hydrated. In no acute distress Vitals:   05/22/16 1332  BP: (!) 103/51  Pulse: (!) 57  Resp: 16  Temp: 97.9 F (36.6 C)  TempSrc: Oral  SpO2: 93%  Weight: 250 lb (113.4 kg)  Height: 5\' 9"  (1.753 m)  PF: (!) 3 L/min  BMI Assessment: Estimated body mass index is 36.92 kg/m as calculated from the following:   Height as of this encounter: 5\' 9"  (1.753 m).   Weight as of this encounter: 250 lb (113.4 kg).   BMI interpretation: (35-39.9 kg/m2) = Severe obesity (Class II): This range is associated with a  136% higher incidence of chronic pain. BMI Readings from Last 4 Encounters:  05/22/16 36.92 kg/m  02/28/16 37.51 kg/m  12/16/15 36.77 kg/m  09/20/15 37.76 kg/m   Wt Readings from Last 4 Encounters:  05/22/16 250 lb (113.4 kg)  02/28/16 254 lb (115.2 kg)  12/16/15 249 lb (112.9 kg)  09/20/15 252 lb (114.3 kg)  Psych/Mental status: Alert and oriented x 3 (person, place, & time) Eyes: PERLA Respiratory: No evidence of acute respiratory  distress  Cervical Spine Exam  Inspection: No masses, redness, or swelling Alignment: Symmetrical Functional ROM: ROM appears unrestricted Stability: No instability detected Muscle strength & Tone: Functionally intact Sensory: Unimpaired Palpation: Non-contributory  Upper Extremity (UE) Exam    Side: Right upper extremity  Side: Left upper extremity  Inspection: No masses, redness, swelling, or asymmetry  Inspection: No masses, redness, swelling, or asymmetry  Functional ROM: ROM appears unrestricted          Functional ROM: ROM appears unrestricted          Muscle strength & Tone: Functionally intact  Muscle strength & Tone: Functionally intact  Sensory: Unimpaired  Sensory: Unimpaired  Palpation: Non-contributory  Palpation: Non-contributory   Thoracic Spine Exam  Inspection: No masses, redness, or swelling Alignment: Symmetrical Functional ROM: ROM appears unrestricted Stability: No instability detected Sensory: Unimpaired Muscle strength & Tone: Functionally intact Palpation: Non-contributory  Lumbar Spine Exam  Inspection: No masses, redness, or swelling Alignment: Symmetrical Functional ROM: ROM appears unrestricted Stability: No instability detected Muscle strength & Tone: Functionally intact Sensory: Unimpaired Palpation: Non-contributory Provocative Tests: Lumbar Hyperextension and rotation test: evaluation deferred today       Patrick's Maneuver: evaluation deferred today              Gait & Posture Assessment  Ambulation: Patient ambulates using a wheel chair Gait: Very limited, using assistive device to ambulate Posture: Poor   Lower Extremity Exam    Side: Right lower extremity  Side: Left lower extremity  Inspection: No masses, redness, swelling, or asymmetry  Inspection: No masses, redness, swelling, or asymmetry  Functional ROM: Limited ROM          Functional ROM: Limited ROM          Muscle strength & Tone: Moderate-to-severe deconditioning  Muscle  strength & Tone: Moderate-to-severe deconditioning  Sensory: Unimpaired  Sensory: Unimpaired  Palpation: Non-contributory  Palpation: Non-contributory   Assessment & Plan  Primary Diagnosis & Pertinent Problem List: The primary encounter diagnosis was Chronic pain. Diagnoses of Long term current use of opiate analgesic, Opiate use (90 MME/Day), and Chronic shoulder pain (Location of Primary Source of Pain) (Bilateral) (R>L) were also pertinent to this visit.  Visit Diagnosis: 1. Chronic pain   2. Long term current use of opiate analgesic   3. Opiate use (90 MME/Day)   4. Chronic shoulder pain (Location of Primary Source of Pain) (Bilateral) (R>L)     Problems updated and reviewed during this visit: No problems updated. Problem-specific Plan(s): No problem-specific Assessment & Plan notes found for this encounter.  No new Assessment & Plan notes have been filed under this hospital service since the last note was generated. Service: Pain Management  Plan of Care   Problem List Items Addressed This Visit      High   Chronic pain - Primary (Chronic)   Relevant Medications   oxyCODONE (OXY IR/ROXICODONE) 5 MG immediate release tablet (Start on 06/12/2016)   oxyCODONE (OXY IR/ROXICODONE) 5 MG immediate release tablet (Start on 07/12/2016)  oxyCODONE (OXY IR/ROXICODONE) 5 MG immediate release tablet (Start on 08/11/2016)   oxyCODONE (ROXICODONE) 15 MG immediate release tablet (Start on 06/12/2016)   oxyCODONE (ROXICODONE) 15 MG immediate release tablet (Start on 07/12/2016)   oxyCODONE (ROXICODONE) 15 MG immediate release tablet (Start on 08/11/2016)   Chronic shoulder pain (Location of Primary Source of Pain) (Bilateral) (R>L) (Chronic)   Relevant Orders   SUPRASCAPULAR NERVE BLOCK     Medium   Long term current use of opiate analgesic (Chronic)   Relevant Orders   ToxASSURE Select 13 (MW), Urine   Opiate use (90 MME/Day) (Chronic)    Other Visit Diagnoses   None.     Pharmacotherapy (Medications Ordered): Meds ordered this encounter  Medications  . oxyCODONE (OXY IR/ROXICODONE) 5 MG immediate release tablet    Sig: Take 1 tablet (5 mg total) by mouth every 8 (eight) hours as needed for severe pain.    Dispense:  90 tablet    Refill:  0    Do not place this medication, or any other prescription from our practice, on "Automatic Refill". Patient may have prescription filled one day early if pharmacy is closed on scheduled refill date. Do not fill until: 06/12/16 To last until: 07/12/16  . oxyCODONE (OXY IR/ROXICODONE) 5 MG immediate release tablet    Sig: Take 1 tablet (5 mg total) by mouth every 8 (eight) hours as needed for severe pain.    Dispense:  90 tablet    Refill:  0    Do not place this medication, or any other prescription from our practice, on "Automatic Refill". Patient may have prescription filled one day early if pharmacy is closed on scheduled refill date. Do not fill until: 07/12/16 To last until: 08/11/16  . oxyCODONE (OXY IR/ROXICODONE) 5 MG immediate release tablet    Sig: Take 1 tablet (5 mg total) by mouth every 8 (eight) hours as needed for severe pain.    Dispense:  90 tablet    Refill:  0    Do not place this medication, or any other prescription from our practice, on "Automatic Refill". Patient may have prescription filled one day early if pharmacy is closed on scheduled refill date. Do not fill until: 08/11/16 To last until: 09/10/16  . oxyCODONE (ROXICODONE) 15 MG immediate release tablet    Sig: Take 1 tablet (15 mg total) by mouth every 8 (eight) hours as needed for pain.    Dispense:  90 tablet    Refill:  0    Do not place this medication, or any other prescription from our practice, on "Automatic Refill". Patient may have prescription filled one day early if pharmacy is closed on scheduled refill date. Do not fill until: 06/12/16 To last until: 07/12/16  . oxyCODONE (ROXICODONE) 15 MG immediate release tablet     Sig: Take 1 tablet (15 mg total) by mouth every 8 (eight) hours as needed for pain.    Dispense:  90 tablet    Refill:  0    Do not place this medication, or any other prescription from our practice, on "Automatic Refill". Patient may have prescription filled one day early if pharmacy is closed on scheduled refill date. Do not fill until: 07/12/16 To last until: 08/11/16  . oxyCODONE (ROXICODONE) 15 MG immediate release tablet    Sig: Take 1 tablet (15 mg total) by mouth every 8 (eight) hours as needed for pain.    Dispense:  90 tablet    Refill:  0  Do not place this medication, or any other prescription from our practice, on "Automatic Refill". Patient may have prescription filled one day early if pharmacy is closed on scheduled refill date. Do not fill until: 08/11/16 To last until: 09/10/16   [facility] & Procedure Ordered: Orders Placed This Encounter  Procedures  . SUPRASCAPULAR NERVE BLOCK  . ToxASSURE Select 13 (MW), Urine   Imaging Ordered: None  Interventional Therapies: Scheduled:  None at this point.    Considering:   Bilateral intra-articular shoulder injection under fluoroscopic guidance, no sedation. The patient was instructed to stop the Coumadin 5 days prior to procedure.  Diagnostic bilateral suprascapular nerve block under fluoroscopic guidance, with or without sedation.  Possible bilateral suprascapular nerve radiofrequency ablation under fluoroscopic guidance and IV sedation.    PRN Procedures:   Palliative Bilateral intra-articular shoulder injection under fluoroscopic guidance, no sedation. The patient was instructed to stop the Coumadin 5 days prior to procedure.  Diagnostic bilateral suprascapular nerve block under fluoroscopic guidance, with or without sedation.    Referral(s) or Consult(s): None at this time.  New Prescriptions   No medications on file    Medications administered during this visit: Bryan Merritt had no medications administered during  this visit.  Requested PM Follow-up: Return in 3 months (on 08/21/2016) for Med-Mgmt, In addition, (PRN) Procedure.  Future Appointments Date Time Provider [place]  08/21/2016 1:40 PM Milinda Pointer, MD [facility] None    Primary Care Physician: Shella Spearing, MD Location: [facility] Outpatient Pain Management Facility Note by: Kathlen Brunswick Dossie Arbour, M.D, DABA, DABAPM, DABPM, DABIPP, FIPP  Pain Score Disclaimer: We use the NRS-11 scale. This is a self-reported, subjective measurement of pain severity with only modest accuracy. It is used primarily to identify changes within a particular patient. It must be understood that outpatient pain scales are significantly less accurate that those used for research, where they can be applied under ideal controlled circumstances with minimal exposure to variables. In reality, the score is likely to be a combination of pain intensity and pain affect, where pain affect describes the degree of emotional arousal or changes in action readiness caused by the sensory experience of pain. Factors such as social and work situation, setting, emotional state, anxiety levels, expectation, and prior pain experience may influence pain perception and show large inter-individual differences that may also be affected by time variables.  Patient instructions provided during this appointment: There are no Patient Instructions on file for this visit.

## 2016-08-02 DIAGNOSIS — J449 Chronic obstructive pulmonary disease, unspecified: Secondary | ICD-10-CM | POA: Insufficient documentation

## 2016-08-21 ENCOUNTER — Encounter: Payer: Medicare Other | Admitting: Pain Medicine

## 2016-08-22 ENCOUNTER — Encounter: Payer: Self-pay | Admitting: Pain Medicine

## 2016-08-22 ENCOUNTER — Ambulatory Visit: Payer: Medicare Other | Attending: Pain Medicine | Admitting: Pain Medicine

## 2016-08-22 VITALS — BP 101/47 | HR 53 | Temp 98.7°F | Resp 16 | Ht 69.0 in | Wt 250.0 lb

## 2016-08-22 DIAGNOSIS — H409 Unspecified glaucoma: Secondary | ICD-10-CM | POA: Diagnosis not present

## 2016-08-22 DIAGNOSIS — Z79899 Other long term (current) drug therapy: Secondary | ICD-10-CM | POA: Insufficient documentation

## 2016-08-22 DIAGNOSIS — I2510 Atherosclerotic heart disease of native coronary artery without angina pectoris: Secondary | ICD-10-CM | POA: Diagnosis not present

## 2016-08-22 DIAGNOSIS — K5903 Drug induced constipation: Secondary | ICD-10-CM

## 2016-08-22 DIAGNOSIS — N400 Enlarged prostate without lower urinary tract symptoms: Secondary | ICD-10-CM | POA: Insufficient documentation

## 2016-08-22 DIAGNOSIS — I252 Old myocardial infarction: Secondary | ICD-10-CM | POA: Insufficient documentation

## 2016-08-22 DIAGNOSIS — Z87891 Personal history of nicotine dependence: Secondary | ICD-10-CM | POA: Diagnosis not present

## 2016-08-22 DIAGNOSIS — I4891 Unspecified atrial fibrillation: Secondary | ICD-10-CM | POA: Diagnosis not present

## 2016-08-22 DIAGNOSIS — T402X5A Adverse effect of other opioids, initial encounter: Secondary | ICD-10-CM | POA: Diagnosis not present

## 2016-08-22 DIAGNOSIS — E1122 Type 2 diabetes mellitus with diabetic chronic kidney disease: Secondary | ICD-10-CM | POA: Diagnosis not present

## 2016-08-22 DIAGNOSIS — Z9889 Other specified postprocedural states: Secondary | ICD-10-CM | POA: Insufficient documentation

## 2016-08-22 DIAGNOSIS — G8929 Other chronic pain: Secondary | ICD-10-CM

## 2016-08-22 DIAGNOSIS — I219 Acute myocardial infarction, unspecified: Secondary | ICD-10-CM | POA: Insufficient documentation

## 2016-08-22 DIAGNOSIS — Z79891 Long term (current) use of opiate analgesic: Secondary | ICD-10-CM | POA: Insufficient documentation

## 2016-08-22 DIAGNOSIS — G894 Chronic pain syndrome: Secondary | ICD-10-CM | POA: Diagnosis present

## 2016-08-22 DIAGNOSIS — J449 Chronic obstructive pulmonary disease, unspecified: Secondary | ICD-10-CM | POA: Diagnosis not present

## 2016-08-22 DIAGNOSIS — Z7901 Long term (current) use of anticoagulants: Secondary | ICD-10-CM | POA: Insufficient documentation

## 2016-08-22 DIAGNOSIS — H548 Legal blindness, as defined in USA: Secondary | ICD-10-CM | POA: Diagnosis not present

## 2016-08-22 DIAGNOSIS — K5900 Constipation, unspecified: Secondary | ICD-10-CM | POA: Insufficient documentation

## 2016-08-22 DIAGNOSIS — N189 Chronic kidney disease, unspecified: Secondary | ICD-10-CM | POA: Insufficient documentation

## 2016-08-22 DIAGNOSIS — Z7982 Long term (current) use of aspirin: Secondary | ICD-10-CM | POA: Diagnosis not present

## 2016-08-22 DIAGNOSIS — I5020 Unspecified systolic (congestive) heart failure: Secondary | ICD-10-CM | POA: Diagnosis not present

## 2016-08-22 DIAGNOSIS — E11311 Type 2 diabetes mellitus with unspecified diabetic retinopathy with macular edema: Secondary | ICD-10-CM | POA: Insufficient documentation

## 2016-08-22 DIAGNOSIS — F1190 Opioid use, unspecified, uncomplicated: Secondary | ICD-10-CM

## 2016-08-22 DIAGNOSIS — E669 Obesity, unspecified: Secondary | ICD-10-CM | POA: Insufficient documentation

## 2016-08-22 DIAGNOSIS — Z888 Allergy status to other drugs, medicaments and biological substances status: Secondary | ICD-10-CM | POA: Insufficient documentation

## 2016-08-22 DIAGNOSIS — Z794 Long term (current) use of insulin: Secondary | ICD-10-CM | POA: Insufficient documentation

## 2016-08-22 DIAGNOSIS — E7800 Pure hypercholesterolemia, unspecified: Secondary | ICD-10-CM | POA: Insufficient documentation

## 2016-08-22 DIAGNOSIS — K219 Gastro-esophageal reflux disease without esophagitis: Secondary | ICD-10-CM | POA: Insufficient documentation

## 2016-08-22 DIAGNOSIS — M25511 Pain in right shoulder: Secondary | ICD-10-CM

## 2016-08-22 DIAGNOSIS — F411 Generalized anxiety disorder: Secondary | ICD-10-CM | POA: Insufficient documentation

## 2016-08-22 DIAGNOSIS — Z9049 Acquired absence of other specified parts of digestive tract: Secondary | ICD-10-CM | POA: Insufficient documentation

## 2016-08-22 DIAGNOSIS — Z86718 Personal history of other venous thrombosis and embolism: Secondary | ICD-10-CM | POA: Insufficient documentation

## 2016-08-22 DIAGNOSIS — K859 Acute pancreatitis without necrosis or infection, unspecified: Secondary | ICD-10-CM | POA: Diagnosis not present

## 2016-08-22 DIAGNOSIS — M25512 Pain in left shoulder: Secondary | ICD-10-CM | POA: Diagnosis not present

## 2016-08-22 MED ORDER — OXYCODONE HCL 5 MG PO TABS: 5.0000 mg | ORAL_TABLET | Freq: Three times a day (TID) | ORAL | 0 refills | Status: AC | PRN

## 2016-08-22 MED ORDER — LUBIPROSTONE 8 MCG PO CAPS: 8.0000 ug | ORAL_CAPSULE | Freq: Two times a day (BID) | ORAL | 99 refills | Status: AC

## 2016-08-22 MED ORDER — OXYCODONE HCL 15 MG PO TABS: 15.0000 mg | ORAL_TABLET | Freq: Three times a day (TID) | ORAL | 0 refills | Status: AC | PRN

## 2016-08-22 MED ORDER — BENEFIBER PO POWD: ORAL | 99 refills | Status: AC

## 2016-08-22 NOTE — Progress Notes (Signed)
Patient's Name: Bryan Merritt  MRN: [id number]  Referring Provider: Shella Spearing, MD  DOB: 10-Aug-1936  PCP: Shella Spearing, MD  DOS: 08/22/2016  Note by: Kathlen Brunswick. Dossie Arbour, MD  Service setting: Ambulatory outpatient  Specialty: Interventional Pain Management  Location: [facility] (AMB) Pain Management Facility    Patient type: Established   Primary Reason(s) for Merritt: Encounter for prescription drug management (Level of risk: moderate) CC: Shoulder Pain (s/p bilateral surgery); Knee Pain (bilateral); and Back Pain (lower)  HPI  Bryan Merritt is a 80 y.o. year old, male patient, who comes today for a medication management evaluation. He has Encounter for therapeutic drug level monitoring; Long term current use of opiate analgesic; Opiate use (90 MME/Day); Chronic pain syndrome; Actinic keratoses; Atrial fibrillation ([place]); Borderline glaucoma; Benign fibroma of prostate; Arteriosclerosis of coronary artery; Calcific shoulder tendinitis; Type 2 diabetes mellitus with diabetic chronic kidney disease ([place]); Chronic obstructive pulmonary disease ([place]); Diabetic macular edema ([place]); Gastro-esophageal reflux disease without esophagitis; Type 2 diabetes mellitus ([place]); H/O neoplasm; Hypercholesterolemia; Benign hypertension; Adult hypothyroidism; Other reduced mobility; Lichenification and lichen simplex chronicus; Recurrent major depression in remission ([place]); Moderate nonproliferative diabetic retinopathy ([place]); Obstructive apnea; H/O malignant neoplasm of skin; Other sexual dysfunction not due to a substance or known physiological condition; Systolic heart failure ([place]); Proliferative diabetic retinopathy ([place]); Chronic shoulder pain (Location of Primary Source of Pain) (Bilateral) (R>L); Chronic low back pain; Chronic upper back pain; Chronic knee pain (Bilateral); Legally blind; History of knee surgery; Obesity, Class II, BMI 35-39.9; Diabetic peripheral neuropathy ([place]); Hx of long term use of blood thinners;  Generalized anxiety disorder; Depression; History of MI (myocardial infarction); History of DVT (deep vein thrombosis); Ex-smoker; Emphysema lung ([place]); Long term prescription opiate use; Encounter for chronic pain management; Breathlessness on exertion; Neurogenic pain; Neuropathic pain; Senile osteoporosis; Prostatic hypertrophy; Urinary anomaly; At high risk for falls; At risk for falling; Open wound of heel; Opioid-induced constipation (OIC); and COPD (chronic obstructive pulmonary disease) ([place]) on his problem list. His primarily concern today is the Shoulder Pain (s/p bilateral surgery); Knee Pain (bilateral); and Back Pain (lower)  Pain Assessment: Self-Reported Pain Score: 3 /10             Reported level is compatible with observation.       Pain Type: Chronic pain Pain Location: Shoulder (lower back and knees) Pain Orientation: Left, Right Pain Descriptors / Indicators: Constant, Nagging, Discomfort, Aching, Burning, Sharp Pain Frequency: Constant  Bryan Merritt was last seen on 05/22/2016 for medication management. During today's appointment we reviewed Bryan Merritt chronic pain status, as well as his outpatient medication regimen.  The patient  has no drug history on file. His body mass index is 36.92 kg/m.  Further details on both, my assessment(s), as well as the proposed treatment plan, please see below.  Controlled Substance Pharmacotherapy Assessment REMS (Risk Evaluation and Mitigation Strategy)  Analgesic:Oxycodone IR 15 mg every 8 hours (45 mg/day) + oxycodone IR 5 mg every 8 hours (15 mg/day) (total 60 mg/day) MME/day:90 mg/day. Patterson,Delores G, RN  08/22/2016  1:56 PM  Sign at close encounter Nursing Pain Medication Assessment:  Safety precautions to be maintained throughout the outpatient stay will include: orient to surroundings, keep bed in low position, maintain call bell within reach at all times, provide assistance with transfer out of bed and ambulation.    Medication Inspection Compliance: Pill count conducted under aseptic conditions, in front of the patient. Neither the pills nor the bottle was removed from  the patient's sight at any time. Once count was completed pills were immediately returned to the patient in their original bottle.  Medication #1: Oxycodone IR Pill Count: 46 of 90 pills remain Bottle Appearance: Standard pharmacy container. Clearly labeled. Filled Date: 50 / 13 / 2017 Medication last intake:08/22/16  Medication #2: Oxycodone ER (OxyContin) Pill Count: 74 of 90 pills remain Bottle Appearance: Standard pharmacy container. Clearly labeled. Filled Date: 98 / 12 / 2017 Medication last intake: 08/22/16   Pharmacokinetics: Liberation and absorption (onset of action): WNL Distribution (time to peak effect): WNL Metabolism and excretion (duration of action): WNL         Pharmacodynamics: Desired effects: Analgesia: Bryan Merritt reports >50% benefit. Functional ability: Patient reports that medication allows him to accomplish basic ADLs Clinically meaningful improvement in function (CMIF): Sustained CMIF goals met Perceived effectiveness: Described as relatively effective, allowing for increase in activities of daily living (ADL) Undesirable effects: Side-effects or Adverse reactions: None reported Monitoring: [place] PMP: Online review of the past 56-monthperiod conducted. Compliant with practice rules and regulations List of all UDS test(s) done:  No results found for: TOXASSSELUR, SUMMARY Last UDS on record: No results found for: TOXASSSELUR UDS interpretation: Compliant          Medication Assessment Form: Reviewed. Patient indicates being compliant with therapy Treatment compliance: Compliant Risk Assessment Profile: Aberrant behavior: See prior evaluations. None observed or detected today Comorbid factors increasing risk of overdose: See prior notes. No additional risks detected today Risk of substance use disorder  (SUD): Low Opioid Risk Tool (ORT) Total Score: 0  Interpretation Table:  Score <3 = Low Risk for SUD  Score between 4-7 = Moderate Risk for SUD  Score >8 = High Risk for Opioid Abuse   Risk Mitigation Strategies:  Patient Counseling: Covered Patient-Prescriber Agreement (PPA): Present and active  Notification to other healthcare providers: Done  Pharmacologic Plan: No change in therapy, at this time  Laboratory Chemistry  Inflammation Markers Lab Results  Component Value Date   ESRSEDRATE 37 (H) 09/20/2015   CRP 0.6 09/20/2015   Renal Function Lab Results  Component Value Date   BUN 32 (H) 09/20/2015   CREATININE 1.48 (H) 09/20/2015   GFRAA 51 (L) 09/20/2015   GFRNONAA 44 (L) 09/20/2015   Hepatic Function Lab Results  Component Value Date   AST 16 09/20/2015   ALT 17 09/20/2015   ALBUMIN 3.9 09/20/2015   Electrolytes Lab Results  Component Value Date   NA 136 09/20/2015   K 3.8 09/20/2015   CL 95 (L) 09/20/2015   CALCIUM 8.6 (L) 09/20/2015   MG 2.1 09/20/2015   Pain Modulating Vitamins Lab Results  Component Value Date   VD125OH2TOT 31.2 09/20/2015   VITAMINB12 368 09/20/2015   Coagulation Parameters Lab Results  Component Value Date   INR 1.7 07/31/2012   LABPROT 20.2 (H) 07/31/2012   APTT 30.9 02/06/2012   PLT 194 02/07/2012   Cardiovascular Lab Results  Component Value Date   HGB 12.3 (L) 02/07/2012   HCT 35.2 (L) 02/07/2012   Note: Lab results reviewed.  Recent Diagnostic Imaging Review  No results found. Note: Imaging results reviewed.          Meds  The patient has a current medication list which includes the following prescription(s): albuterol, alprazolam, aspirin, atorvastatin, b-d ins syr ultrafine 1cc/31g, bayer contour test, glucocom blood glucose monitor, ra blood glucose monitor, combigan, furosemide, insulin glargine, insulin regular, insulin syringe-needle u-100, insulin syringe .5cc/31gx5/16", ipratropium, accu-chek  multiclix,  losartan, magnesium oxide, menthol (topical analgesic), metoprolol succinate, naloxone, nitroglycerin, omeprazole, oxycodone, oxycodone, oxycodone, oxycodone, oxycodone, oxycodone, paroxetine, polyethylene glycol, tamsulosin, terconazole, triamcinolone ointment, warfarin, warfarin, benefiber, and lubiprostone.  Current Outpatient Prescriptions on File Prior to Merritt  Medication Sig  . albuterol (PROVENTIL HFA;VENTOLIN HFA) 108 (90 Base) MCG/ACT inhaler Inhale 2 puffs into the lungs 3 (three) times daily.  Marland Kitchen ALPRAZolam (XANAX) 0.5 MG tablet Take 0.5 mg by mouth at bedtime as needed for anxiety.  Marland Kitchen aspirin 81 MG tablet Take 81 mg by mouth daily.  Marland Kitchen atorvastatin (LIPITOR) 40 MG tablet Take 40 mg by mouth daily.  . B-D INS SYR ULTRAFINE 1CC/31G 31G X 5/16" 1 ML MISC See admin instructions. with insulin  . BAYER CONTOUR TEST test strip USE WITH METER TO TEST BLOOD SUGAR 6 TIMES DAILY. DX:E11.21  . Blood Glucose Monitoring Suppl (GLUCOCOM BLOOD GLUCOSE MONITOR) DEVI Use to check blood sugar 6 times daily. Dx: E11.21. Dispense Contour glucose meter  . Blood Glucose Monitoring Suppl (RA BLOOD GLUCOSE MONITOR) DEVI   . COMBIGAN 0.2-0.5 % ophthalmic solution Place 1 drop into both eyes 2 (two) times daily.   . furosemide (LASIX) 40 MG tablet Take 120 mg by mouth 2 (two) times daily.   . insulin glargine (LANTUS) 100 UNIT/ML injection Inject 40 Units into the skin 3 (three) times daily.  . Insulin Syringe-Needle U-100 (B-D INS SYR ULTRAFINE 1CC/31G) 31G X 5/16" 1 ML MISC   . ipratropium (ATROVENT HFA) 17 MCG/ACT inhaler Inhale 2 puffs into the lungs 2 (two) times daily.  . Lancets (ACCU-CHEK MULTICLIX) lancets Use to test blood sugar 6 times daily. Dx. E11.21  . losartan (COZAAR) 25 MG tablet TAKE 1 TABLET (25 MG TOTAL) BY MOUTH DAILY.  . magnesium oxide (MAG-OX) 400 MG tablet Take 400 mg by mouth daily.  . Menthol, Topical Analgesic, 4 % GEL Apply topically.  . metoprolol succinate (TOPROL-XL) 100 MG 24  hr tablet Take 100 mg by mouth daily. Take with or immediately following a meal.  . Naloxone HCl (NARCAN) 4 MG/0.1ML LIQD Place 1 Bottle into the nose once. , then call 911, repeat if needed in other nostril with new bottle.  . nitroGLYCERIN (NITROSTAT) 0.4 MG SL tablet Place 0.4 mg under the tongue every 5 (five) minutes as needed for chest pain.  Marland Kitchen omeprazole (PRILOSEC) 20 MG capsule Take 20 mg by mouth 2 (two) times daily before a meal.  . PARoxetine (PAXIL) 20 MG tablet TAKE 1 TABLET (20 MG TOTAL) BY MOUTH EVERY MORNING.  Marland Kitchen polyethylene glycol (MIRALAX / GLYCOLAX) packet Take 17 g by mouth as needed.   . tamsulosin (FLOMAX) 0.4 MG CAPS capsule Take 0.8 mg by mouth daily. Reported on 02/28/2016  . terconazole (TERAZOL 7) 0.4 % vaginal cream Apply to affected area 3 times daily  . triamcinolone ointment (KENALOG) 0.1 % Apply topically as needed.    No current facility-administered medications on file prior to Merritt.    ROS  Constitutional: Denies any fever or chills Gastrointestinal: No reported hemesis, hematochezia, vomiting, or acute GI distress Musculoskeletal: Denies any acute onset joint swelling, redness, loss of ROM, or weakness Neurological: No reported episodes of acute onset apraxia, aphasia, dysarthria, agnosia, amnesia, paralysis, loss of coordination, or loss of consciousness  Allergies  Bryan Merritt is allergic to lisinopril.  PFSH  Drug: Bryan Merritt  has no drug history on file. Alcohol:  has no alcohol history on file. Tobacco:  reports that he has never smoked. He  has never used smokeless tobacco. Medical:  has a past medical history of Anxiety; Asthma; CAD (coronary artery disease); Cancer ([place]) (2016); Cardiac arrhythmia; Depression; Diabetes mellitus without complication ([place]); Emphysema lung ([place]) (06/21/2015); Emphysema of lung ([place]); Ex-smoker (06/21/2015); Gallbladder disease; History of DVT (deep vein thrombosis) (06/21/2015); History of MI (myocardial infarction)  (06/21/2015); Hypercholesteremia; MI (myocardial infarction); Muscle disorder; Pancreatitis; and Spine disorder. Family: family history includes Alcohol abuse in his father; Cancer in his mother; Diabetes in his mother; Heart disease in his father.  Past Surgical History:  Procedure Laterality Date  . APPENDECTOMY    . BACK SURGERY     lumbar  . cardiac stents     x 7  . CHOLECYSTECTOMY    . KNEE SURGERY Bilateral    Constitutional Exam  General appearance: Well nourished, well developed, and well hydrated. In no apparent acute distress Vitals:   08/22/16 1335  BP: (!) 101/47  Pulse: (!) 53  Resp: 16  Temp: 98.7 F (37.1 C)  TempSrc: Oral  SpO2: 96%  Weight: 250 lb (113.4 kg)  Height: 5' 9"  (1.753 m)   BMI Assessment: Estimated body mass index is 36.92 kg/m as calculated from the following:   Height as of this encounter: 5' 9"  (1.753 m).   Weight as of this encounter: 250 lb (113.4 kg).  BMI interpretation table: BMI level Category Range association with higher incidence of chronic pain  <18 kg/m2 Underweight   18.5-24.9 kg/m2 Ideal body weight   25-29.9 kg/m2 Overweight Increased incidence by 20%  30-34.9 kg/m2 Obese (Class I) Increased incidence by 68%  35-39.9 kg/m2 Severe obesity (Class II) Increased incidence by 136%  >40 kg/m2 Extreme obesity (Class III) Increased incidence by 254%   BMI Readings from Last 4 Encounters:  08/22/16 36.92 kg/m  05/22/16 36.92 kg/m  02/28/16 37.51 kg/m  12/16/15 36.77 kg/m   Wt Readings from Last 4 Encounters:  08/22/16 250 lb (113.4 kg)  05/22/16 250 lb (113.4 kg)  02/28/16 254 lb (115.2 kg)  12/16/15 249 lb (112.9 kg)  Psych/Mental status: Alert, oriented x 3 (person, place, & time) Eyes: PERLA Respiratory: No evidence of acute respiratory distress  Cervical Spine Exam  Inspection: No masses, redness, or swelling Alignment: Symmetrical Functional ROM: Unrestricted ROM Stability: No instability detected Muscle  strength & Tone: Functionally intact Sensory: Unimpaired Palpation: Non-contributory  Upper Extremity (UE) Exam    Side: Right upper extremity  Side: Left upper extremity  Inspection: No masses, redness, swelling, or asymmetry  Inspection: No masses, redness, swelling, or asymmetry  Functional ROM: Unrestricted ROM          Functional ROM: Unrestricted ROM          Muscle strength & Tone: Functionally intact  Muscle strength & Tone: Functionally intact  Sensory: Unimpaired  Sensory: Unimpaired  Palpation: Non-contributory  Palpation: Non-contributory   Thoracic Spine Exam  Inspection: No masses, redness, or swelling Alignment: Symmetrical Functional ROM: Unrestricted ROM Stability: No instability detected Sensory: Unimpaired Muscle strength & Tone: Functionally intact Palpation: Non-contributory  Lumbar Spine Exam  Inspection: No masses, redness, or swelling Alignment: Symmetrical Functional ROM: Unrestricted ROM Stability: No instability detected Muscle strength & Tone: Functionally intact Sensory: Unimpaired Palpation: Non-contributory Provocative Tests: Lumbar Hyperextension and rotation test: evaluation deferred today       Patrick's Maneuver: evaluation deferred today              Gait & Posture Assessment  Ambulation: Patient ambulates using a wheel chair Gait: Very limited,  using assistive device to ambulate Posture: WNL   Lower Extremity Exam    Side: Right lower extremity  Side: Left lower extremity  Inspection: No masses, redness, swelling, or asymmetry  Inspection: No masses, redness, swelling, or asymmetry  Functional ROM: Unrestricted ROM          Functional ROM: Unrestricted ROM          Muscle strength & Tone: Functionally intact  Muscle strength & Tone: Functionally intact  Sensory: Neuropathic pain pattern  Sensory: Neuropathic pain pattern  Palpation: Non-contributory  Palpation: Non-contributory   Assessment  Primary Diagnosis & Pertinent Problem  List: The primary encounter diagnosis was Chronic pain syndrome. Diagnoses of Chronic shoulder pain (Location of Primary Source of Pain) (Bilateral) (R>L), Long term current use of opiate analgesic, Opiate use (90 MME/Day), and Opioid-induced constipation (OIC) were also pertinent to this Merritt.  Status Diagnosis   Stable  Stable  Stable 1. Chronic pain syndrome   2. Chronic shoulder pain (Location of Primary Source of Pain) (Bilateral) (R>L)   3. Long term current use of opiate analgesic   4. Opiate use (90 MME/Day)   5. Opioid-induced constipation (OIC)      Plan of Care  Pharmacotherapy (Medications Ordered): Meds ordered this encounter  Medications  . oxyCODONE (ROXICODONE) 15 MG immediate release tablet    Sig: Take 1 tablet (15 mg total) by mouth every 8 (eight) hours as needed for pain.    Dispense:  90 tablet    Refill:  0    Do not place this medication, or any other prescription from our practice, on "Automatic Refill". Patient may have prescription filled one day early if pharmacy is closed on scheduled refill date. Do not fill until: 09/10/16 To last until: 10/10/16  . oxyCODONE (OXY IR/ROXICODONE) 5 MG immediate release tablet    Sig: Take 1 tablet (5 mg total) by mouth every 8 (eight) hours as needed for severe pain.    Dispense:  90 tablet    Refill:  0    Do not place this medication, or any other prescription from our practice, on "Automatic Refill". Patient may have prescription filled one day early if pharmacy is closed on scheduled refill date. Do not fill until: 09/10/16 To last until: 10/10/16  . oxyCODONE (OXY IR/ROXICODONE) 5 MG immediate release tablet    Sig: Take 1 tablet (5 mg total) by mouth every 8 (eight) hours as needed for severe pain.    Dispense:  90 tablet    Refill:  0    Do not place this medication, or any other prescription from our practice, on "Automatic Refill". Patient may have prescription filled one day early if pharmacy is closed on  scheduled refill date. Do not fill until: 10/10/16 To last until: 11/09/16  . oxyCODONE (OXY IR/ROXICODONE) 5 MG immediate release tablet    Sig: Take 1 tablet (5 mg total) by mouth every 8 (eight) hours as needed for severe pain.    Dispense:  90 tablet    Refill:  0    Do not place this medication, or any other prescription from our practice, on "Automatic Refill". Patient may have prescription filled one day early if pharmacy is closed on scheduled refill date. Do not fill until: 11/09/16 To last until: 12/09/16  . oxyCODONE (ROXICODONE) 15 MG immediate release tablet    Sig: Take 1 tablet (15 mg total) by mouth every 8 (eight) hours as needed for pain.    Dispense:  90  tablet    Refill:  0    Do not place this medication, or any other prescription from our practice, on "Automatic Refill". Patient may have prescription filled one day early if pharmacy is closed on scheduled refill date. Do not fill until: 10/10/16 To last until: 11/09/16  . oxyCODONE (ROXICODONE) 15 MG immediate release tablet    Sig: Take 1 tablet (15 mg total) by mouth every 8 (eight) hours as needed for pain.    Dispense:  90 tablet    Refill:  0    Do not place this medication, or any other prescription from our practice, on "Automatic Refill". Patient may have prescription filled one day early if pharmacy is closed on scheduled refill date. Do not fill until: 11/09/16 To last until: 12/09/16  . lubiprostone (AMITIZA) 8 MCG capsule    Sig: Take 1 capsule (8 mcg total) by mouth 2 (two) times daily with a meal. Swallow the medication whole. Do not break or chew the medication.    Dispense:  60 capsule    Refill:  PRN    Do not place this medication, or any other prescription from our practice, on "Automatic Refill". Patient may have prescription filled one day early if pharmacy is closed on scheduled refill date.  . Wheat Dextrin (BENEFIBER) POWD    Sig: Stir 2 tsp. TID into 4-8 oz of any non-carbonated beverage or  soft food (hot or cold)    Dispense:  500 g    Refill:  PRN    This is an OTC product. This prescription is to serve as a reminder to the patient as to our preference.   New Prescriptions   LUBIPROSTONE (AMITIZA) 8 MCG CAPSULE    Take 1 capsule (8 mcg total) by mouth 2 (two) times daily with a meal. Swallow the medication whole. Do not break or chew the medication.   WHEAT DEXTRIN (BENEFIBER) POWD    Stir 2 tsp. TID into 4-8 oz of any non-carbonated beverage or soft food (hot or cold)   Medications administered today: Bryan Merritt. Lab-work, procedure(s), and/or referral(s): No orders of the defined types were placed in this encounter.  Imaging and/or referral(s): None  Interventional therapies: Planned, scheduled, and/or pending:   None at this time.    Considering:   Stop Coumadin 5 days prior to procedures. Bilateral intra-articular shoulder injection Diagnostic bilateral suprascapular nerve block Possible bilateral suprascapular nerve radiofrequency ablation   Palliative PRN treatment(s):   Palliative Bilateral intra-articular shoulder injection under fluoroscopic guidance, no sedation. Diagnostic bilateral suprascapular nerve block under fluoroscopic guidance, with or without sedation.    Provider-requested follow-up: Return in about 3 months (around 11/20/2016) for Med-Mgmt, in addition, (PRN) procedure.  Future Appointments Date Time Provider [place]  11/14/2016 2:00 PM Milinda Pointer, MD [facility] None   Primary Care Physician: Shella Spearing, MD Location: [facility] Management Facility Note by: Kathlen Brunswick. Dossie Arbour, M.D, DABA, DABAPM, DABPM, DABIPP, FIPP Date: 08/22/16; Time: 2:26 PM  Pain Score Disclaimer: We use the NRS-11 scale. This is a self-reported, subjective measurement of pain severity with only modest accuracy. It is used primarily to identify changes within a particular patient. It must  be understood that outpatient pain scales are significantly less accurate that those used for research, where they can be applied under ideal controlled circumstances with minimal exposure to variables. In reality, the score is likely to be a combination of pain intensity and pain affect,  where pain affect describes the degree of emotional arousal or changes in action readiness caused by the sensory experience of pain. Factors such as social and work situation, setting, emotional state, anxiety levels, expectation, and prior pain experience may influence pain perception and show large inter-individual differences that may also be affected by time variables.  Patient instructions provided during this appointment: Patient Instructions   GENERAL RISKS AND COMPLICATIONS  What are the risk, side effects and possible complications? Generally speaking, most procedures are safe.  However, with any procedure there are risks, side effects, and the possibility of complications.  The risks and complications are dependent upon the sites that are lesioned, or the type of nerve block to be performed.  The closer the procedure is to the spine, the more serious the risks are.  Great care is taken when placing the radio frequency needles, block needles or lesioning probes, but sometimes complications can occur. 1. Infection: Any time there is an injection through the skin, there is a risk of infection.  This is why sterile conditions are used for these blocks.  There are four possible types of infection. 1. Localized skin infection. 2. Central Nervous System Infection-This can be in the form of Meningitis, which can be deadly. 3. Epidural Infections-This can be in the form of an epidural abscess, which can cause pressure inside of the spine, causing compression of the spinal cord with subsequent paralysis. This would require an emergency surgery to decompress, and there are no guarantees that the patient would recover  from the paralysis. 4. Discitis-This is an infection of the intervertebral discs.  It occurs in about 1% of discography procedures.  It is difficult to treat and it may lead to surgery.        2. Pain: the needles have to go through skin and soft tissues, will cause soreness.       3. Damage to internal structures:  The nerves to be lesioned may be near blood vessels or    other nerves which can be potentially damaged.       4. Bleeding: Bleeding is more common if the patient is taking blood thinners such as  aspirin, Coumadin, Ticiid, Plavix, etc., or if he/she have some genetic predisposition  such as hemophilia. Bleeding into the spinal canal can cause compression of the spinal  cord with subsequent paralysis.  This would require an emergency surgery to  decompress and there are no guarantees that the patient would recover from the  paralysis.       5. Pneumothorax:  Puncturing of a lung is a possibility, every time a needle is introduced in  the area of the chest or upper back.  Pneumothorax refers to free air around the  collapsed lung(s), inside of the thoracic cavity (chest cavity).  Another two possible  complications related to a similar event would include: Hemothorax and Chylothorax.   These are variations of the Pneumothorax, where instead of air around the collapsed  lung(s), you may have blood or chyle, respectively.       6. Spinal headaches: They may occur with any procedures in the area of the spine.       7. Persistent CSF (Cerebro-Spinal Fluid) leakage: This is a rare problem, but may occur  with prolonged intrathecal or epidural catheters either due to the formation of a fistulous  track or a dural tear.       8. Nerve damage: By working so close to the spinal cord, there is  always a possibility of  nerve damage, which could be as serious as a permanent spinal cord injury with  paralysis.       9. Death:  Although rare, severe deadly allergic reactions known as "Anaphylactic  reaction"  can occur to any of the medications used.      10. Worsening of the symptoms:  We can always make thing worse.  What are the chances of something like this happening? Chances of any of this occuring are extremely low.  By statistics, you have more of a chance of getting killed in a motor vehicle accident: while driving to the hospital than any of the above occurring .  Nevertheless, you should be aware that they are possibilities.  In general, it is similar to taking a shower.  Everybody knows that you can slip, hit your head and get killed.  Does that mean that you should not shower again?  Nevertheless always keep in mind that statistics do not mean anything if you happen to be on the wrong side of them.  Even if a procedure has a 1 (one) in a 1,000,000 (million) chance of going wrong, it you happen to be that one..Also, keep in mind that by statistics, you have more of a chance of having something go wrong when taking medications.  Who should not have this procedure? If you are on a blood thinning medication (e.g. Coumadin, Plavix, see list of "Blood Thinners"), or if you have an active infection going on, you should not have the procedure.  If you are taking any blood thinners, please inform your physician.  How should I prepare for this procedure?  Do not eat or drink anything at least six hours prior to the procedure.  Bring a driver with you .  It cannot be a taxi.  Come accompanied by an adult that can drive you back, and that is strong enough to help you if your legs get weak or numb from the local anesthetic.  Take all of your medicines the morning of the procedure with just enough water to swallow them.  If you have diabetes, make sure that you are scheduled to have your procedure done first thing in the morning, whenever possible.  If you have diabetes, take only half of your insulin dose and notify our nurse that you have done so as soon as you arrive at the clinic.  If you are  diabetic, but only take blood sugar pills (oral hypoglycemic), then do not take them on the morning of your procedure.  You may take them after you have had the procedure.  Do not take aspirin or any aspirin-containing medications, at least eleven (11) days prior to the procedure.  They may prolong bleeding.  Wear loose fitting clothing that may be easy to take off and that you would not mind if it got stained with Betadine or blood.  Do not wear any jewelry or perfume  Remove any nail coloring.  It will interfere with some of our monitoring equipment.  NOTE: Remember that this is not meant to be interpreted as a complete list of all possible complications.  Unforeseen problems may occur.  BLOOD THINNERS The following drugs contain aspirin or other products, which can cause increased bleeding during surgery and should not be taken for 2 weeks prior to and 1 week after surgery.  If you should need take something for relief of minor pain, you may take acetaminophen which is found in Tylenol,m Datril, Anacin-3 and  Panadol. It is not blood thinner. The products listed below are.  Do not take any of the products listed below in addition to any listed on your instruction sheet.  A.P.C or A.P.C with Codeine Codeine Phosphate Capsules #3 Ibuprofen Ridaura  ABC compound Congesprin Imuran rimadil  Advil Cope Indocin Robaxisal  Alka-Seltzer Effervescent Pain Reliever and Antacid Coricidin or Coricidin-D  Indomethacin Rufen  Alka-Seltzer plus Cold Medicine Cosprin Ketoprofen S-A-C Tablets  Anacin Analgesic Tablets or Capsules Coumadin Korlgesic Salflex  Anacin Extra Strength Analgesic tablets or capsules CP-2 Tablets Lanoril Salicylate  Anaprox Cuprimine Capsules Levenox Salocol  Anexsia-D Dalteparin Magan Salsalate  Anodynos Darvon compound Magnesium Salicylate Sine-off  Ansaid Dasin Capsules Magsal Sodium Salicylate  Anturane Depen Capsules Marnal Soma  APF Arthritis pain formula Dewitt's Pills  Measurin Stanback  Argesic Dia-Gesic Meclofenamic Sulfinpyrazone  Arthritis Bayer Timed Release Aspirin Diclofenac Meclomen Sulindac  Arthritis pain formula Anacin Dicumarol Medipren Supac  Analgesic (Safety coated) Arthralgen Diffunasal Mefanamic Suprofen  Arthritis Strength Bufferin Dihydrocodeine Mepro Compound Suprol  Arthropan liquid Dopirydamole Methcarbomol with Aspirin Synalgos  ASA tablets/Enseals Disalcid Micrainin Tagament  Ascriptin Doan's Midol Talwin  Ascriptin A/D Dolene Mobidin Tanderil  Ascriptin Extra Strength Dolobid Moblgesic Ticlid  Ascriptin with Codeine Doloprin or Doloprin with Codeine Momentum Tolectin  Asperbuf Duoprin Mono-gesic Trendar  Aspergum Duradyne Motrin or Motrin IB Triminicin  Aspirin plain, buffered or enteric coated Durasal Myochrisine Trigesic  Aspirin Suppositories Easprin Nalfon Trillsate  Aspirin with Codeine Ecotrin Regular or Extra Strength Naprosyn Uracel  Atromid-S Efficin Naproxen Ursinus  Auranofin Capsules Elmiron Neocylate Vanquish  Axotal Emagrin Norgesic Verin  Azathioprine Empirin or Empirin with Codeine Normiflo Vitamin E  Azolid Emprazil Nuprin Voltaren  Bayer Aspirin plain, buffered or children's or timed BC Tablets or powders Encaprin Orgaran Warfarin Sodium  Buff-a-Comp Enoxaparin Orudis Zorpin  Buff-a-Comp with Codeine Equegesic Os-Cal-Gesic   Buffaprin Excedrin plain, buffered or Extra Strength Oxalid   Bufferin Arthritis Strength Feldene Oxphenbutazone   Bufferin plain or Extra Strength Feldene Capsules Oxycodone with Aspirin   Bufferin with Codeine Fenoprofen Fenoprofen Pabalate or Pabalate-SF   Buffets II Flogesic Panagesic   Buffinol plain or Extra Strength Florinal or Florinal with Codeine Panwarfarin   Buf-Tabs Flurbiprofen Penicillamine   Butalbital Compound Four-way cold tablets Penicillin   Butazolidin Fragmin Pepto-Bismol   Carbenicillin Geminisyn Percodan   Carna Arthritis Reliever Geopen Persantine     Carprofen Gold's salt Persistin   Chloramphenicol Goody's Phenylbutazone   Chloromycetin Haltrain Piroxlcam   Clmetidine heparin Plaquenil   Cllnoril Hyco-pap Ponstel   Clofibrate Hydroxy chloroquine Propoxyphen         Before stopping any of these medications, be sure to consult the physician who ordered them.  Some, such as Coumadin (Warfarin) are ordered to prevent or treat serious conditions such as "deep thrombosis", "pumonary embolisms", and other heart problems.  The amount of time that you may need off of the medication may also vary with the medication and the reason for which you were taking it.  If you are taking any of these medications, please make sure you notify your pain physician before you undergo any procedures.         Trigger Point Injection Trigger points are areas where you have pain. A trigger point injection is a shot given in the trigger point to help relieve pain for a few days to a few months. Common places for trigger points include:  The neck.  The shoulders.  The upper back.  The lower back. A  trigger point injection will not cure long-lasting (chronic) pain permanently. These injections do not always work for every person, but for some people they can help to relieve pain for a few days to a few months. Tell a health care provider about:  Any allergies you have.  All medicines you are taking, including vitamins, herbs, eye drops, creams, and over-the-counter medicines.  Any problems you or family members have had with anesthetic medicines.  Any blood disorders you have.  Any surgeries you have had.  Any medical conditions you have. What are the risks? Generally, this is a safe procedure. However, problems may occur, including:  Infection.  Bleeding.  Allergic reaction to the injected medicine.  Irritation of the skin around the injection site. What happens before the procedure?  Ask your health care provider about changing or  stopping your regular medicines. This is especially important if you are taking diabetes medicines or blood thinners. What happens during the procedure?  Your health care provider will feel for trigger points. A marker may be used to circle the area for the injection.  The skin over the trigger point will be washed with a germ-killing (antiseptic) solution.  A thin needle is used for the shot. You may feel pain or a twitching feeling when the needle enters the trigger point.  A numbing solution may be injected into the trigger point. Sometimes a medicine to keep down swelling, redness, and warmth (inflammation) is also injected.  Your health care provider may move the needle around the area where the trigger point is located until the tightness and twitching goes away.  After the injection, your health care provider may put gentle pressure over the injection site.  The injection site will be covered with a bandage (dressing). The procedure may vary among health care providers and hospitals. What happens after the procedure?  The dressing can be taken off in a few hours or as told by your health care provider.  You may feel sore and stiff for 1-2 days. This information is not intended to replace advice given to you by your health care provider. Make sure you discuss any questions you have with your health care provider. Document Released: 08/10/2011 Document Revised: 04/23/2016 Document Reviewed: 02/08/2015 Elsevier Interactive Patient Education  2017 [employer].

## 2016-08-22 NOTE — Patient Instructions (Signed)
GENERAL RISKS AND COMPLICATIONS  What are the risk, side effects and possible complications? Generally speaking, most procedures are safe.  However, with any procedure there are risks, side effects, and the possibility of complications.  The risks and complications are dependent upon the sites that are lesioned, or the type of nerve block to be performed.  The closer the procedure is to the spine, the more serious the risks are.  Great care is taken when placing the radio frequency needles, block needles or lesioning probes, but sometimes complications can occur. 1. Infection: Any time there is an injection through the skin, there is a risk of infection.  This is why sterile conditions are used for these blocks.  There are four possible types of infection. 1. Localized skin infection. 2. Central Nervous System Infection-This can be in the form of Meningitis, which can be deadly. 3. Epidural Infections-This can be in the form of an epidural abscess, which can cause pressure inside of the spine, causing compression of the spinal cord with subsequent paralysis. This would require an emergency surgery to decompress, and there are no guarantees that the patient would recover from the paralysis. 4. Discitis-This is an infection of the intervertebral discs.  It occurs in about 1% of discography procedures.  It is difficult to treat and it may lead to surgery.        2. Pain: the needles have to go through skin and soft tissues, will cause soreness.       3. Damage to internal structures:  The nerves to be lesioned may be near blood vessels or    other nerves which can be potentially damaged.       4. Bleeding: Bleeding is more common if the patient is taking blood thinners such as  aspirin, Coumadin, Ticiid, Plavix, etc., or if he/she have some genetic predisposition  such as hemophilia. Bleeding into the spinal canal can cause compression of the spinal  cord with subsequent paralysis.  This would require an  emergency surgery to  decompress and there are no guarantees that the patient would recover from the  paralysis.       5. Pneumothorax:  Puncturing of a lung is a possibility, every time a needle is introduced in  the area of the chest or upper back.  Pneumothorax refers to free air around the  collapsed lung(s), inside of the thoracic cavity (chest cavity).  Another two possible  complications related to a similar event would include: Hemothorax and Chylothorax.   These are variations of the Pneumothorax, where instead of air around the collapsed  lung(s), you may have blood or chyle, respectively.       6. Spinal headaches: They may occur with any procedures in the area of the spine.       7. Persistent CSF (Cerebro-Spinal Fluid) leakage: This is a rare problem, but may occur  with prolonged intrathecal or epidural catheters either due to the formation of a fistulous  track or a dural tear.       8. Nerve damage: By working so close to the spinal cord, there is always a possibility of  nerve damage, which could be as serious as a permanent spinal cord injury with  paralysis.       9. Death:  Although rare, severe deadly allergic reactions known as "Anaphylactic  reaction" can occur to any of the medications used.      10. Worsening of the symptoms:  We can always make thing worse.    What are the chances of something like this happening? Chances of any of this occuring are extremely low.  By statistics, you have more of a chance of getting killed in a motor vehicle accident: while driving to the hospital than any of the above occurring .  Nevertheless, you should be aware that they are possibilities.  In general, it is similar to taking a shower.  Everybody knows that you can slip, hit your head and get killed.  Does that mean that you should not shower again?  Nevertheless always keep in mind that statistics do not mean anything if you happen to be on the wrong side of them.  Even if a procedure has a 1  (one) in a 1,000,000 (million) chance of going wrong, it you happen to be that one..Also, keep in mind that by statistics, you have more of a chance of having something go wrong when taking medications.  Who should not have this procedure? If you are on a blood thinning medication (e.g. Coumadin, Plavix, see list of "Blood Thinners"), or if you have an active infection going on, you should not have the procedure.  If you are taking any blood thinners, please inform your physician.  How should I prepare for this procedure?  Do not eat or drink anything at least six hours prior to the procedure.  Bring a driver with you .  It cannot be a taxi.  Come accompanied by an adult that can drive you back, and that is strong enough to help you if your legs get weak or numb from the local anesthetic.  Take all of your medicines the morning of the procedure with just enough water to swallow them.  If you have diabetes, make sure that you are scheduled to have your procedure done first thing in the morning, whenever possible.  If you have diabetes, take only half of your insulin dose and notify our nurse that you have done so as soon as you arrive at the clinic.  If you are diabetic, but only take blood sugar pills (oral hypoglycemic), then do not take them on the morning of your procedure.  You may take them after you have had the procedure.  Do not take aspirin or any aspirin-containing medications, at least eleven (11) days prior to the procedure.  They may prolong bleeding.  Wear loose fitting clothing that may be easy to take off and that you would not mind if it got stained with Betadine or blood.  Do not wear any jewelry or perfume  Remove any nail coloring.  It will interfere with some of our monitoring equipment.  NOTE: Remember that this is not meant to be interpreted as a complete list of all possible complications.  Unforeseen problems may occur.  BLOOD THINNERS The following drugs  contain aspirin or other products, which can cause increased bleeding during surgery and should not be taken for 2 weeks prior to and 1 week after surgery.  If you should need take something for relief of minor pain, you may take acetaminophen which is found in Tylenol,m Datril, Anacin-3 and Panadol. It is not blood thinner. The products listed below are.  Do not take any of the products listed below in addition to any listed on your instruction sheet.  A.P.C or A.P.C with Codeine Codeine Phosphate Capsules #3 Ibuprofen Ridaura  ABC compound Congesprin Imuran rimadil  Advil Cope Indocin Robaxisal  Alka-Seltzer Effervescent Pain Reliever and Antacid Coricidin or Coricidin-D  Indomethacin Rufen    Alka-Seltzer plus Cold Medicine Cosprin Ketoprofen S-A-C Tablets  Anacin Analgesic Tablets or Capsules Coumadin Korlgesic Salflex  Anacin Extra Strength Analgesic tablets or capsules CP-2 Tablets Lanoril Salicylate  Anaprox Cuprimine Capsules Levenox Salocol  Anexsia-D Dalteparin Magan Salsalate  Anodynos Darvon compound Magnesium Salicylate Sine-off  Ansaid Dasin Capsules Magsal Sodium Salicylate  Anturane Depen Capsules Marnal Soma  APF Arthritis pain formula Dewitt's Pills Measurin Stanback  Argesic Dia-Gesic Meclofenamic Sulfinpyrazone  Arthritis Bayer Timed Release Aspirin Diclofenac Meclomen Sulindac  Arthritis pain formula Anacin Dicumarol Medipren Supac  Analgesic (Safety coated) Arthralgen Diffunasal Mefanamic Suprofen  Arthritis Strength Bufferin Dihydrocodeine Mepro Compound Suprol  Arthropan liquid Dopirydamole Methcarbomol with Aspirin Synalgos  ASA tablets/Enseals Disalcid Micrainin Tagament  Ascriptin Doan's Midol Talwin  Ascriptin A/D Dolene Mobidin Tanderil  Ascriptin Extra Strength Dolobid Moblgesic Ticlid  Ascriptin with Codeine Doloprin or Doloprin with Codeine Momentum Tolectin  Asperbuf Duoprin Mono-gesic Trendar  Aspergum Duradyne Motrin or Motrin IB Triminicin  Aspirin  plain, buffered or enteric coated Durasal Myochrisine Trigesic  Aspirin Suppositories Easprin Nalfon Trillsate  Aspirin with Codeine Ecotrin Regular or Extra Strength Naprosyn Uracel  Atromid-S Efficin Naproxen Ursinus  Auranofin Capsules Elmiron Neocylate Vanquish  Axotal Emagrin Norgesic Verin  Azathioprine Empirin or Empirin with Codeine Normiflo Vitamin E  Azolid Emprazil Nuprin Voltaren  Bayer Aspirin plain, buffered or children's or timed BC Tablets or powders Encaprin Orgaran Warfarin Sodium  Buff-a-Comp Enoxaparin Orudis Zorpin  Buff-a-Comp with Codeine Equegesic Os-Cal-Gesic   Buffaprin Excedrin plain, buffered or Extra Strength Oxalid   Bufferin Arthritis Strength Feldene Oxphenbutazone   Bufferin plain or Extra Strength Feldene Capsules Oxycodone with Aspirin   Bufferin with Codeine Fenoprofen Fenoprofen Pabalate or Pabalate-SF   Buffets II Flogesic Panagesic   Buffinol plain or Extra Strength Florinal or Florinal with Codeine Panwarfarin   Buf-Tabs Flurbiprofen Penicillamine   Butalbital Compound Four-way cold tablets Penicillin   Butazolidin Fragmin Pepto-Bismol   Carbenicillin Geminisyn Percodan   Carna Arthritis Reliever Geopen Persantine   Carprofen Gold's salt Persistin   Chloramphenicol Goody's Phenylbutazone   Chloromycetin Haltrain Piroxlcam   Clmetidine heparin Plaquenil   Cllnoril Hyco-pap Ponstel   Clofibrate Hydroxy chloroquine Propoxyphen         Before stopping any of these medications, be sure to consult the physician who ordered them.  Some, such as Coumadin (Warfarin) are ordered to prevent or treat serious conditions such as "deep thrombosis", "pumonary embolisms", and other heart problems.  The amount of time that you may need off of the medication may also vary with the medication and the reason for which you were taking it.  If you are taking any of these medications, please make sure you notify your pain physician before you undergo any  procedures.         Trigger Point Injection Trigger points are areas where you have pain. A trigger point injection is a shot given in the trigger point to help relieve pain for a few days to a few months. Common places for trigger points include:  The neck.  The shoulders.  The upper back.  The lower back. A trigger point injection will not cure long-lasting (chronic) pain permanently. These injections do not always work for every person, but for some people they can help to relieve pain for a few days to a few months. Tell a health care provider about:  Any allergies you have.  All medicines you are taking, including vitamins, herbs, eye drops, creams, and over-the-counter medicines.  Any problems you or  family members have had with anesthetic medicines.  Any blood disorders you have.  Any surgeries you have had.  Any medical conditions you have. What are the risks? Generally, this is a safe procedure. However, problems may occur, including:  Infection.  Bleeding.  Allergic reaction to the injected medicine.  Irritation of the skin around the injection site. What happens before the procedure?  Ask your health care provider about changing or stopping your regular medicines. This is especially important if you are taking diabetes medicines or blood thinners. What happens during the procedure?  Your health care provider will feel for trigger points. A marker may be used to circle the area for the injection.  The skin over the trigger point will be washed with a germ-killing (antiseptic) solution.  A thin needle is used for the shot. You may feel pain or a twitching feeling when the needle enters the trigger point.  A numbing solution may be injected into the trigger point. Sometimes a medicine to keep down swelling, redness, and warmth (inflammation) is also injected.  Your health care provider may move the needle around the area where the trigger point is  located until the tightness and twitching goes away.  After the injection, your health care provider may put gentle pressure over the injection site.  The injection site will be covered with a bandage (dressing). The procedure may vary among health care providers and hospitals. What happens after the procedure?  The dressing can be taken off in a few hours or as told by your health care provider.  You may feel sore and stiff for 1-2 days. This information is not intended to replace advice given to you by your health care provider. Make sure you discuss any questions you have with your health care provider. Document Released: 08/10/2011 Document Revised: 04/23/2016 Document Reviewed: 02/08/2015 Elsevier Interactive Patient Education  2017 [employer].

## 2016-08-22 NOTE — Progress Notes (Signed)
Nursing Pain Medication Assessment:  Safety precautions to be maintained throughout the outpatient stay will include: orient to surroundings, keep bed in low position, maintain call bell within reach at all times, provide assistance with transfer out of bed and ambulation.  Medication Inspection Compliance: Pill count conducted under aseptic conditions, in front of the patient. Neither the pills nor the bottle was removed from the patient's sight at any time. Once count was completed pills were immediately returned to the patient in their original bottle.  Medication #1: Oxycodone IR Pill Count: 46 of 90 pills remain Bottle Appearance: Standard pharmacy container. Clearly labeled. Filled Date: 66 / 13 / 2017 Medication last intake:08/22/16  Medication #2: Oxycodone ER (OxyContin) Pill Count: 74 of 90 pills remain Bottle Appearance: Standard pharmacy container. Clearly labeled. Filled Date: 64 / 12 / 2017 Medication last intake: 08/22/16

## 2016-11-14 ENCOUNTER — Ambulatory Visit: Payer: Medicare Other | Admitting: Pain Medicine

## 2017-02-02 DEATH — deceased
